# Patient Record
Sex: Female | Born: 1990 | Race: Black or African American | Hispanic: No | Marital: Married | Smoking: Former smoker
Health system: Southern US, Community
[De-identification: ages and names within clinical notes are randomized; demographics above are authoritative.]

## PROBLEM LIST (undated history)

## (undated) ENCOUNTER — Emergency Department (HOSPITAL_COMMUNITY): Payer: Medicaid Other | Source: Home / Self Care

---

## 2013-04-12 ENCOUNTER — Emergency Department (HOSPITAL_COMMUNITY)
Admission: EM | Admit: 2013-04-12 | Discharge: 2013-04-12 | Disposition: A | Discharge status: Home / Self Care | Payer: Medicaid Other | Attending: Emergency Medicine | Admitting: Emergency Medicine

## 2013-04-12 ENCOUNTER — Encounter (HOSPITAL_COMMUNITY): Payer: Self-pay | Admitting: Emergency Medicine

## 2013-04-12 DIAGNOSIS — Z3202 Encounter for pregnancy test, result negative: Secondary | ICD-10-CM | POA: Insufficient documentation

## 2013-04-12 DIAGNOSIS — R102 Pelvic and perineal pain: Secondary | ICD-10-CM

## 2013-04-12 DIAGNOSIS — Z79899 Other long term (current) drug therapy: Secondary | ICD-10-CM | POA: Insufficient documentation

## 2013-04-12 DIAGNOSIS — J309 Allergic rhinitis, unspecified: Secondary | ICD-10-CM | POA: Insufficient documentation

## 2013-04-12 DIAGNOSIS — R1904 Left lower quadrant abdominal swelling, mass and lump: Secondary | ICD-10-CM | POA: Insufficient documentation

## 2013-04-12 DIAGNOSIS — N390 Urinary tract infection, site not specified: Secondary | ICD-10-CM | POA: Insufficient documentation

## 2013-04-12 DIAGNOSIS — N949 Unspecified condition associated with female genital organs and menstrual cycle: Secondary | ICD-10-CM | POA: Insufficient documentation

## 2013-04-12 DIAGNOSIS — J3489 Other specified disorders of nose and nasal sinuses: Secondary | ICD-10-CM | POA: Insufficient documentation

## 2013-04-12 DIAGNOSIS — Z87891 Personal history of nicotine dependence: Secondary | ICD-10-CM | POA: Insufficient documentation

## 2013-04-12 LAB — URINALYSIS, ROUTINE W REFLEX MICROSCOPIC
Bilirubin Urine: NEGATIVE
Hgb urine dipstick: NEGATIVE
Ketones, ur: NEGATIVE mg/dL
Protein, ur: NEGATIVE mg/dL
Urobilinogen, UA: 0.2 mg/dL (ref 0.0–1.0)

## 2013-04-12 LAB — POCT PREGNANCY, URINE: Preg Test, Ur: NEGATIVE

## 2013-04-12 LAB — URINE MICROSCOPIC-ADD ON

## 2013-04-12 LAB — WET PREP, GENITAL: Clue Cells Wet Prep HPF POC: NONE SEEN

## 2013-04-12 LAB — GLUCOSE, CAPILLARY: Glucose-Capillary: 92 mg/dL (ref 70–99)

## 2013-04-12 MED ORDER — AZITHROMYCIN 250 MG PO TABS
1000.0000 mg | ORAL_TABLET | Freq: Once | ORAL | Status: AC
  Administered 2013-04-12: 1000 mg via ORAL
  Filled 2013-04-12: qty 4

## 2013-04-12 MED ORDER — FLUCONAZOLE 150 MG PO TABS: 150.0000 mg | ORAL_TABLET | Freq: Once | ORAL | Status: DC

## 2013-04-12 MED ORDER — LIDOCAINE HCL (PF) 1 % IJ SOLN
INTRAMUSCULAR | Status: AC
  Filled 2013-04-12: qty 5

## 2013-04-12 MED ORDER — CEFTRIAXONE SODIUM 250 MG IJ SOLR
250.0000 mg | Freq: Once | INTRAMUSCULAR | Status: AC
  Administered 2013-04-12: 250 mg via INTRAMUSCULAR
  Filled 2013-04-12: qty 250

## 2013-04-12 MED ORDER — NITROFURANTOIN MONOHYD MACRO 100 MG PO CAPS: 100.0000 mg | ORAL_CAPSULE | Freq: Two times a day (BID) | ORAL | Status: DC

## 2013-04-12 NOTE — ED Notes (Signed)
Pt states that she was diagnosed with yeast infection that keeps coming back.  LMP last week.  No diabetes

## 2013-04-12 NOTE — ED Provider Notes (Signed)
CSN: [id number]     Arrival date & time 04/12/13  1043 History   First MD Initiated Contact with Patient 04/12/13 1201     Chief Complaint  Patient presents with  . Vaginal Discharge   (Consider location/radiation/quality/duration/timing/severity/associated sxs/prior Treatment) Patient is a 22 y.o. female presenting with vaginal discharge.  Vaginal Discharge Associated symptoms: no fever    22 yo female presents with 2 wk hx of vaginal discharge. Patient admits to hx of yeast infections over the past year. Patient monagamous with 1 partner, though recently separated. Denies sexual intercourse in 3 months. Patient admits to white discharge that is malodorous with sxs of vaginal burning/itching. Denies abdominal pain, N/V/D/C. Admits to dysuria and pain with wiping. Pain rated 7/10. Patient has tried Diflucan and Clotrimazole in the past for similar sxs, but sxs continue to return follow treatment. Patient on implanon x 1 yr. PMH significant for Cesarean delivery x 2. LMP was 1-2 wks ago.  History reviewed. No pertinent past medical history. Past Surgical History  Procedure Laterality Date  . Cesarean section     No family history on file. History  Substance Use Topics  . Smoking status: Former Games developer  . Smokeless tobacco: Not on file  . Alcohol Use: No   OB History   Grav Para Term Preterm Abortions TAB SAB Ect Mult Living                 Review of Systems  Constitutional: Negative for fever and chills.  HENT: Positive for congestion and rhinorrhea.   Eyes: Negative for visual disturbance.  Respiratory: Negative for cough and shortness of breath.   Cardiovascular: Negative for chest pain and leg swelling.  Gastrointestinal: Negative for blood in stool.  Genitourinary: Positive for vaginal discharge. Negative for flank pain, vaginal bleeding, vaginal pain and pelvic pain.  Musculoskeletal: Negative for arthralgias and back pain.    Allergies  Review of patient's allergies  indicates no known allergies.  Home Medications   Current Outpatient Rx  Name  Route  Sig  Dispense  Refill  . clotrimazole (GYNE-LOTRIMIN) 1 % vaginal cream   Vaginal   Place 1 Applicatorful vaginally daily.         Marland Kitchen etonogestrel (IMPLANON) 68 MG IMPL implant   Subcutaneous   Inject 1 each into the skin once.         . fluconazole (DIFLUCAN) 150 MG tablet   Oral   Take 1 tablet (150 mg total) by mouth once.   1 tablet   0   . nitrofurantoin, macrocrystal-monohydrate, (MACROBID) 100 MG capsule   Oral   Take 1 capsule (100 mg total) by mouth 2 (two) times daily.   10 capsule   0    BP 105/66  Pulse 80  Temp(Src) 98.4 F (36.9 C) (Oral)  Resp 16  Wt 111 lb 8 oz (50.576 kg)  SpO2 99%  LMP 04/01/2013 Physical Exam  Nursing note and vitals reviewed. Constitutional: She is oriented to person, place, and time. She appears well-developed and well-nourished. No distress.  HENT:  Head: Normocephalic and atraumatic.  Cardiovascular: Normal rate and regular rhythm.  Exam reveals no gallop and no friction rub.   No murmur heard. Pulmonary/Chest: Effort normal and breath sounds normal. No respiratory distress. She has no wheezes. She has no rales.  Abdominal: Soft. Bowel sounds are normal. She exhibits mass. She exhibits no distension. There is tenderness. There is no rigidity, no rebound, no guarding, no tenderness at McBurney's point and  negative Murphy's sign.    Patient tender in LEFT adnexal region. Firm  mass palpated in LEFT adnexa.   Genitourinary: Pelvic exam was performed with patient supine. There is no rash, tenderness, lesion or injury on the right labia. There is no rash, tenderness, lesion or injury on the left labia. Right adnexum displays no mass and no tenderness. Left adnexum displays mass and tenderness. No erythema, tenderness or bleeding around the vagina. No foreign body around the vagina. No signs of injury around the vagina. Vaginal discharge (thick  clumpy white discharge) found.  Musculoskeletal: Normal range of motion. She exhibits no edema.  Neurological: She is alert and oriented to person, place, and time.  Skin: Skin is warm and dry. No rash noted. She is not diaphoretic.  Psychiatric: She has a normal mood and affect. Her behavior is normal.    ED Course  Procedures (including critical care time) Labs Review Labs Reviewed  WET PREP, GENITAL - Abnormal; Notable for the following:    Yeast Wet Prep HPF POC FEW (*)    WBC, Wet Prep HPF POC MODERATE (*)    All other components within normal limits  URINALYSIS, ROUTINE W REFLEX MICROSCOPIC - Abnormal; Notable for the following:    APPearance CLOUDY (*)    Leukocytes, UA SMALL (*)    All other components within normal limits  URINE MICROSCOPIC-ADD ON - Abnormal; Notable for the following:    Squamous Epithelial / LPF FEW (*)    Bacteria, UA MANY (*)    All other components within normal limits  GC/CHLAMYDIA PROBE AMP  URINE CULTURE  GLUCOSE, CAPILLARY  POCT PREGNANCY, URINE   Imaging Review No results found.  EKG Interpretation   None       MDM   1. UTI (lower urinary tract infection)   2. Pelvic pain     UA consistent with UTI. Plan to tx outpatient with Macrobid. Wet Prep positive for yeast and leukocytes. Will treat prophylactic for G/C and tx outpatient for yeast after patient finishes Macrobid due to suspect poor follow up because patient does not have insurance. Pelvic exam reveals tender left adnexal mass consistent with patient hx of ovarian cysts. Advised patient to follow up with [facility]. Patient also provided with resource guide for follow up reference. Patient educated on exam and lab findings. Educated on use and timing of medications. Recommended return to ED if sxs should worsen. Patient agrees with plan. Discharged in good condition.   Meds given in ED:  Medications  lidocaine (PF) (XYLOCAINE) 1 % injection (not  administered)  cefTRIAXone (ROCEPHIN) injection 250 mg (250 mg Intramuscular Given 04/12/13 1520)  azithromycin (ZITHROMAX) tablet 1,000 mg (1,000 mg Oral Given 04/12/13 1521)    Discharge Medication List as of 04/12/2013  3:04 PM    START taking these medications   Details  fluconazole (DIFLUCAN) 150 MG tablet Take 1 tablet (150 mg total) by mouth once., Starting 04/12/2013, Print    nitrofurantoin, macrocrystal-monohydrate, (MACROBID) 100 MG capsule Take 1 capsule (100 mg total) by mouth 2 (two) times daily., Starting 04/12/2013, Until Discontinued, Print           Rudene Anda, [place] 04/12/13 1810

## 2013-04-12 NOTE — ED Notes (Signed)
Pt was diagnosed with candidiasis approximately 4 months ago with same symptoms for exception of thickness of discharge.  Pt stated discharge was thicker then than it is now.

## 2013-04-12 NOTE — ED Provider Notes (Signed)
Medical screening examination/treatment/procedure(s) were performed by non-physician practitioner and as supervising physician I was immediately available for consultation/collaboration.  EKG Interpretation   None         Audree Camel, MD 04/12/13 2000

## 2013-04-12 NOTE — ED Notes (Signed)
CBG= 92 mg/dl

## 2013-04-13 LAB — URINE CULTURE

## 2014-11-25 ENCOUNTER — Encounter (HOSPITAL_COMMUNITY): Payer: Self-pay | Admitting: *Deleted

## 2014-11-25 ENCOUNTER — Emergency Department (HOSPITAL_COMMUNITY)
Admission: EM | Admit: 2014-11-25 | Discharge: 2014-11-25 | Disposition: A | Discharge status: Home / Self Care | Payer: Medicaid Other | Attending: Emergency Medicine | Admitting: Emergency Medicine

## 2014-11-25 DIAGNOSIS — Z87891 Personal history of nicotine dependence: Secondary | ICD-10-CM | POA: Insufficient documentation

## 2014-11-25 DIAGNOSIS — Z3202 Encounter for pregnancy test, result negative: Secondary | ICD-10-CM | POA: Insufficient documentation

## 2014-11-25 DIAGNOSIS — R109 Unspecified abdominal pain: Secondary | ICD-10-CM

## 2014-11-25 DIAGNOSIS — Z79899 Other long term (current) drug therapy: Secondary | ICD-10-CM | POA: Diagnosis not present

## 2014-11-25 DIAGNOSIS — R1030 Lower abdominal pain, unspecified: Secondary | ICD-10-CM | POA: Insufficient documentation

## 2014-11-25 LAB — WET PREP, GENITAL
Trich, Wet Prep: NONE SEEN
Yeast Wet Prep HPF POC: NONE SEEN

## 2014-11-25 LAB — POC URINE PREG, ED: Preg Test, Ur: NEGATIVE

## 2014-11-25 LAB — URINALYSIS, ROUTINE W REFLEX MICROSCOPIC
Bilirubin Urine: NEGATIVE
Glucose, UA: NEGATIVE mg/dL
Hgb urine dipstick: NEGATIVE
Ketones, ur: NEGATIVE mg/dL
Leukocytes, UA: NEGATIVE
Nitrite: NEGATIVE
Protein, ur: NEGATIVE mg/dL
Specific Gravity, Urine: 1.02 (ref 1.005–1.030)
Urobilinogen, UA: 0.2 mg/dL (ref 0.0–1.0)
pH: 7.5 (ref 5.0–8.0)

## 2014-11-25 MED ORDER — IBUPROFEN 400 MG PO TABS: 400.0000 mg | ORAL_TABLET | Freq: Four times a day (QID) | ORAL | Status: DC | PRN

## 2014-11-25 NOTE — ED Provider Notes (Signed)
CSN: [id number]     Arrival date & time 11/25/14  0547 History   First MD Initiated Contact with Patient 11/25/14 0557     Chief Complaint  Patient presents with  . Abdominal Pain    HPI   24 year old female presents today with suprapubic pain and dysuria. Patient reports symptoms started slowly approximately one week ago and have progressively worsened. She notes the addition of blood in her urine over the last several days. Patient reports she is sexually active, with Implanon for birth control, reports that she had that placed approximately 3 years ago. LMP 11/15/2014, denies vaginal discharge. Patient denies fever, chills, chest pain shortness of breath, dizziness, upper abdominal pain, back pain, changes in bowel characteristics or frequency. Patient denies any recent history of urinary tract infections. Tolerating by mouth.  History reviewed. No pertinent past medical history. Past Surgical History  Procedure Laterality Date  . Cesarean section     No family history on file. History  Substance Use Topics  . Smoking status: Former Games developer  . Smokeless tobacco: Not on file  . Alcohol Use: No   OB History    No data available     Review of Systems  All other systems reviewed and are negative.   Allergies  Review of patient's allergies indicates no known allergies.  Home Medications   Prior to Admission medications   Medication Sig Start Date End Date Taking? Authorizing Provider  etonogestrel (IMPLANON) 68 MG IMPL implant Inject 1 each into the skin once.   Yes Historical Provider, MD  ibuprofen (ADVIL,MOTRIN) 400 MG tablet Take 1 tablet (400 mg total) by mouth every 6 (six) hours as needed. 11/25/14   Eyvonne Mechanic, PA-C   BP 108/75 mmHg  Pulse 67  Temp(Src) 98 F (36.7 C) (Oral)  Resp 17  SpO2 99%  LMP 11/15/2014   Physical Exam  Constitutional: She is oriented to person, place, and time. She appears well-developed and well-nourished.  HENT:  Head: Normocephalic  and atraumatic.  Eyes: Conjunctivae are normal. Pupils are equal, round, and reactive to light. Right eye exhibits no discharge. Left eye exhibits no discharge. No scleral icterus.  Neck: Normal range of motion. No JVD present. No tracheal deviation present.  Cardiovascular: Normal rate, regular rhythm, normal heart sounds and intact distal pulses.   Pulmonary/Chest: Effort normal and breath sounds normal. No stridor. No respiratory distress. She has no wheezes. She has no rales. She exhibits no tenderness.  Abdominal: Soft. Bowel sounds are normal. She exhibits no distension and no mass. There is no hepatosplenomegaly, splenomegaly or hepatomegaly. There is tenderness in the suprapubic area. There is no rebound, no guarding and no CVA tenderness.  Neurological: She is alert and oriented to person, place, and time. Coordination normal.  Psychiatric: She has a normal mood and affect. Her behavior is normal. Judgment and thought content normal.  Nursing note and vitals reviewed.   ED Course  Procedures (including critical care time) Labs Review Labs Reviewed  WET PREP, GENITAL - Abnormal; Notable for the following:    Clue Cells Wet Prep HPF POC FEW (*)    WBC, Wet Prep HPF POC RARE (*)    All other components within normal limits  URINALYSIS, ROUTINE W REFLEX MICROSCOPIC (NOT AT [facility]) - Abnormal; Notable for the following:    APPearance CLOUDY (*)    All other components within normal limits  POC URINE PREG, ED  GC/CHLAMYDIA PROBE AMP ([place]) NOT AT [facility]    Imaging  Review No results found.   EKG Interpretation None      MDM   Final diagnoses:  Abdominal pain, unspecified abdominal location    Labs: Urine pregnancy, urinalysis- clue cells  Imaging:  Consults:  Therapeutics:  Discharge Meds:   Assessment/Plan: 24 year old female presents with suprapubic pain. She has no significant findings on her urinalysis sore physical exam. She is minimally tender to  palpation, remainder of abdomen is nontender. She is tolerating by mouth. Patient denies vaginal discharge, she did have a few clue cells on her wet prep, she reports this is not unusual for she's had bacterial vaginosis before without any symptoms. She had no cervical motion tenderness or significant discharge. Patient requested to leave as she had to pick up her daughter, there are no findings on my exam today that would necessitate her staying for further evaluation or management here in the ED. I informed her that she needs to follow-up with her primary care provider for further evaluation and management of the suprapubic pain. Patient verbalizes understanding and agreement to today's plan and had no further questions or concerns at time of discharge. Patient is advised to use ibuprofen or Tylenol as needed for the pain.         Eyvonne Mechanic, PA-C 11/25/14 1309  Blane Ohara, MD 11/27/14 [phone]

## 2014-11-25 NOTE — ED Notes (Signed)
The pt has nausea no vomiting or diarrhea

## 2014-11-25 NOTE — Discharge Instructions (Signed)
Abdominal Pain, Women °Abdominal (stomach, pelvic, or belly) pain can be caused by many things. It is important to tell your doctor: °· The location of the pain. °· Does it come and go or is it present all the time? °· Are there things that start the pain (eating certain foods, exercise)? °· Are there other symptoms associated with the pain (fever, nausea, vomiting, diarrhea)? °All of this is helpful to know when trying to find the cause of the pain. °CAUSES  °· Stomach: virus or bacteria infection, or ulcer. °· Intestine: appendicitis (inflamed appendix), regional ileitis (Crohn's disease), ulcerative colitis (inflamed colon), irritable bowel syndrome, diverticulitis (inflamed diverticulum of the colon), or cancer of the stomach or intestine. °· Gallbladder disease or stones in the gallbladder. °· Kidney disease, kidney stones, or infection. °· Pancreas infection or cancer. °· Fibromyalgia (pain disorder). °· Diseases of the female organs: °¨ Uterus: fibroid (non-cancerous) tumors or infection. °¨ Fallopian tubes: infection or tubal pregnancy. °¨ Ovary: cysts or tumors. °¨ Pelvic adhesions (scar tissue). °¨ Endometriosis (uterus lining tissue growing in the pelvis and on the pelvic organs). °¨ Pelvic congestion syndrome (female organs filling up with blood just before the menstrual period). °¨ Pain with the menstrual period. °¨ Pain with ovulation (producing an egg). °¨ Pain with an IUD (intrauterine device, birth control) in the uterus. °¨ Cancer of the female organs. °· Functional pain (pain not caused by a disease, may improve without treatment). °· Psychological pain. °· Depression. °DIAGNOSIS  °Your doctor will decide the seriousness of your pain by doing an examination. °· Blood tests. °· X-rays. °· Ultrasound. °· CT scan (computed tomography, special type of X-ray). °· MRI (magnetic resonance imaging). °· Cultures, for infection. °· Barium enema (dye inserted in the large intestine, to better view it with  X-rays). °· Colonoscopy (looking in intestine with a lighted tube). °· Laparoscopy (minor surgery, looking in abdomen with a lighted tube). °· Major abdominal exploratory surgery (looking in abdomen with a large incision). °TREATMENT  °The treatment will depend on the cause of the pain.  °· Many cases can be observed and treated at home. °· Over-the-counter medicines recommended by your caregiver. °· Prescription medicine. °· Antibiotics, for infection. °· Birth control pills, for painful periods or for ovulation pain. °· Hormone treatment, for endometriosis. °· Nerve blocking injections. °· Physical therapy. °· Antidepressants. °· Counseling with a psychologist or psychiatrist. °· Minor or major surgery. °HOME CARE INSTRUCTIONS  °· Do not take laxatives, unless directed by your caregiver. °· Take over-the-counter pain medicine only if ordered by your caregiver. Do not take aspirin because it can cause an upset stomach or bleeding. °· Try a clear liquid diet (broth or water) as ordered by your caregiver. Slowly move to a bland diet, as tolerated, if the pain is related to the stomach or intestine. °· Have a thermometer and take your temperature several times a day, and record it. °· Bed rest and sleep, if it helps the pain. °· Avoid sexual intercourse, if it causes pain. °· Avoid stressful situations. °· Keep your follow-up appointments and tests, as your caregiver orders. °· If the pain does not go away with medicine or surgery, you may try: °¨ Acupuncture. °¨ Relaxation exercises (yoga, meditation). °¨ Group therapy. °¨ Counseling. °SEEK MEDICAL CARE IF:  °· You notice certain foods cause stomach pain. °· Your home care treatment is not helping your pain. °· You need stronger pain medicine. °· You want your IUD removed. °· You feel faint or   lightheaded.  You develop nausea and vomiting.  You develop a rash.  You are having side effects or an allergy to your medicine. SEEK IMMEDIATE MEDICAL CARE IF:   Your  pain does not go away or gets worse.  You have a fever.  Your pain is felt only in portions of the abdomen. The right side could possibly be appendicitis. The left lower portion of the abdomen could be colitis or diverticulitis.  You are passing blood in your stools (bright red or black tarry stools, with or without vomiting).  You have blood in your urine.  You develop chills, with or without a fever.  You pass out. MAKE SURE YOU:   Understand these instructions.  Will watch your condition.  Will get help right away if you are not doing well or get worse. Document Released: 02/02/2007 Document Revised: 08/22/2013 Document Reviewed: 02/22/2009 [facility] Patient Information 2015 [place], [place]. This information is not intended to replace advice given to you by your health care provider. Make sure you discuss any questions you have with your health care provider.  Please monitor for new or worsening signs or symptoms, return immediately if any present. Please use ibuprofen as needed for pain.

## 2014-11-25 NOTE — ED Notes (Signed)
Called lab to check on status of wet prep

## 2014-11-25 NOTE — ED Notes (Signed)
The pt is c/o abd pain for one week.  lmp July 27th

## 2014-11-27 LAB — GC/CHLAMYDIA PROBE AMP (~~LOC~~) NOT AT ARMC
Chlamydia: NEGATIVE
Neisseria Gonorrhea: NEGATIVE

## 2015-04-03 ENCOUNTER — Ambulatory Visit (HOSPITAL_COMMUNITY)
Admission: RE | Admit: 2015-04-03 | Discharge: 2015-04-03 | Disposition: A | Discharge status: Home / Self Care | Payer: Medicaid Other | Attending: Psychiatry | Admitting: Psychiatry

## 2015-04-03 DIAGNOSIS — F39 Unspecified mood [affective] disorder: Secondary | ICD-10-CM | POA: Insufficient documentation

## 2015-04-03 DIAGNOSIS — F329 Major depressive disorder, single episode, unspecified: Secondary | ICD-10-CM | POA: Insufficient documentation
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
## 2015-06-15 ENCOUNTER — Inpatient Hospital Stay (EMERGENCY_DEPARTMENT_HOSPITAL)
Admission: AD | Admit: 2015-06-15 | Discharge: 2015-06-15 | Disposition: A | Discharge status: Home / Self Care | Payer: Medicaid Other | Source: Ambulatory Visit | Attending: Family Medicine | Admitting: Family Medicine

## 2015-06-15 ENCOUNTER — Emergency Department (HOSPITAL_COMMUNITY)
Admission: EM | Admit: 2015-06-15 | Discharge: 2015-06-15 | Disposition: A | Discharge status: Home / Self Care | Payer: Medicaid Other | Attending: Emergency Medicine | Admitting: Emergency Medicine

## 2015-06-15 ENCOUNTER — Encounter (HOSPITAL_COMMUNITY): Payer: Self-pay | Admitting: Emergency Medicine

## 2015-06-15 DIAGNOSIS — M79602 Pain in left arm: Secondary | ICD-10-CM | POA: Diagnosis not present

## 2015-06-15 DIAGNOSIS — Z975 Presence of (intrauterine) contraceptive device: Secondary | ICD-10-CM

## 2015-06-15 DIAGNOSIS — R1110 Vomiting, unspecified: Secondary | ICD-10-CM | POA: Diagnosis not present

## 2015-06-15 DIAGNOSIS — Z793 Long term (current) use of hormonal contraceptives: Secondary | ICD-10-CM | POA: Diagnosis not present

## 2015-06-15 DIAGNOSIS — Z789 Other specified health status: Secondary | ICD-10-CM

## 2015-06-15 DIAGNOSIS — Z87891 Personal history of nicotine dependence: Secondary | ICD-10-CM | POA: Diagnosis not present

## 2015-06-15 DIAGNOSIS — R634 Abnormal weight loss: Secondary | ICD-10-CM

## 2015-06-15 DIAGNOSIS — Z3202 Encounter for pregnancy test, result negative: Secondary | ICD-10-CM

## 2015-06-15 LAB — POCT PREGNANCY, URINE: PREG TEST UR: NEGATIVE

## 2015-06-15 MED ORDER — IBUPROFEN 800 MG PO TABS: 800.0000 mg | ORAL_TABLET | Freq: Three times a day (TID) | ORAL | Status: DC

## 2015-06-15 MED ORDER — PROMETHAZINE HCL 25 MG PO TABS: 12.5000 mg | ORAL_TABLET | Freq: Four times a day (QID) | ORAL | Status: DC | PRN

## 2015-06-15 NOTE — MAU Provider Note (Signed)
Chief Complaint: Arm Pain   None     SUBJECTIVE HPI: Carrie Conner is a 25 y.o.  who presents to maternity admissions reporting pain in her left arm at Nexplanon site and nausea/vomiting with onset 3 months ago causing 50 lb weight loss.  She reports the Nexplanon has been in place >3 years and was placed by her provider in [place].  She reports she is sexually active and is not using another form of birth control at this time.  She is not taking any medications for her nausea and reports nothing makes it better or worse.  It is not associated with pain. She denies vaginal bleeding, vaginal itching/burning, urinary symptoms, h/a, dizziness, or fever/chills.     HPI  No past medical history on file. Past Surgical History  Procedure Laterality Date  . Cesarean section     Social History   Social History  . Marital Status: Single    Spouse Name: N/A  . Number of Children: N/A  . Years of Education: N/A   Occupational History  . Not on file.   Social History Main Topics  . Smoking status: Former Games developer  . Smokeless tobacco: Not on file  . Alcohol Use: No  . Drug Use: No  . Sexual Activity: Not on file   Other Topics Concern  . Not on file   Social History Narrative   No current facility-administered medications on file prior to encounter.   Current Outpatient Prescriptions on File Prior to Encounter  Medication Sig Dispense Refill  . etonogestrel (IMPLANON) 68 MG IMPL implant Inject 1 each into the skin once.    Marland Kitchen ibuprofen (ADVIL,MOTRIN) 800 MG tablet Take 1 tablet (800 mg total) by mouth 3 (three) times daily. 21 tablet 0   No Known Allergies  ROS:  Review of Systems  Constitutional: Positive for appetite change and unexpected weight change. Negative for fever, chills and fatigue.  Respiratory: Negative for shortness of breath.   Cardiovascular: Negative for chest pain.  Gastrointestinal: Positive for nausea and vomiting.  Genitourinary: Negative for dysuria,  flank pain, vaginal bleeding, vaginal discharge, difficulty urinating, vaginal pain and pelvic pain.  Neurological: Negative for dizziness and headaches.  Psychiatric/Behavioral: Negative.      I have reviewed patient's Past Medical Hx, Surgical Hx, Family Hx, Social Hx, medications and allergies.   Physical Exam   Patient Vitals for the past 24 hrs:  BP Temp src Pulse  06/15/15 1158 106/61 mmHg Oral 76   Constitutional: Well-developed, well-nourished female in no acute distress.  Cardiovascular: normal rate Respiratory: normal effort GI: Abd soft, non-tender. Pos BS x 4 MS: Extremities nontender, no edema, normal ROM Neurologic: Alert and oriented x 4.  GU: Neg CVAT.   LAB RESULTS Results for orders placed or performed during the hospital encounter of 06/15/15 (from the past 24 hour(s))  Pregnancy, urine POC     Status: None   Collection Time: 06/15/15 12:04 PM  Result Value Ref Range   Preg Test, Ur NEGATIVE NEGATIVE       IMAGING No results found.  MAU Management/MDM: Pregnancy test ordered related to pt nausea/vomiting x 4 months.  UPT negative.  Discharge pt with Phenergan Rx. Pt to follow up with primary care provider.  Pt not in lobby when called to notify of negative results but pt aware of Rx.  ASSESSMENT 1. Intractable vomiting with nausea, vomiting of unspecified type   2. Weight loss, non-intentional   3. Nexplanon in place   4.  Negative pregnancy test     PLAN Discharge home Phenergan 12.5-25 mg PO Q 6 hours PRN   Medication List    TAKE these medications        ibuprofen 800 MG tablet  Commonly known as:  ADVIL,MOTRIN  Take 1 tablet (800 mg total) by mouth 3 (three) times daily.     IMPLANON 68 MG Impl implant  Generic drug:  etonogestrel  Inject 1 each into the skin once.     promethazine 25 MG tablet  Commonly known as:  PHENERGAN  Take 0.5-1 tablets (12.5-25 mg total) by mouth every 6 (six) hours as needed for nausea.        Follow-up Information    Please follow up.   Why:  Follow up soon with a primary care provider      Sharen Counter Certified Nurse-Midwife 06/15/2015  2:40 PM

## 2015-06-15 NOTE — MAU Note (Signed)
Sharen Counter in to see patient in triage.

## 2015-06-15 NOTE — Discharge Instructions (Signed)
Musculoskeletal Pain  Musculoskeletal pain is muscle and boney aches and pains. These pains can occur in any part of the body. Your caregiver may treat you without knowing the cause of the pain. They may treat you if blood or urine tests, X-rays, and other tests were normal.  CAUSES  There is often not a definite cause or reason for these pains. These pains may be caused by a type of germ (virus). The discomfort may also come from overuse. Overuse includes working out too hard when your body is not fit. Boney aches also come from weather changes. Bone is sensitive to atmospheric pressure changes.  HOME CARE INSTRUCTIONS  Ask when your test results will be ready. Make sure you get your test results.  Only take over-the-counter or prescription medicines for pain, discomfort, or fever as directed by your caregiver. If you were given medications for your condition, do not drive, operate machinery or power tools, or sign legal documents for 24 hours. Do not drink alcohol. Do not take sleeping pills or other medications that may interfere with treatment.  Continue all activities unless the activities cause more pain. When the pain lessens, slowly resume normal activities. Gradually increase the intensity and duration of the activities or exercise.  During periods of severe pain, bed rest may be helpful. Lay or sit in any position that is comfortable.  Putting ice on the injured area.  Put ice in a bag.  Place a towel between your skin and the bag.  Leave the ice on for 15 to 20 minutes, 3 to 4 times a day. Follow up with your caregiver for continued problems and no reason can be found for the pain. If the pain becomes worse or does not go away, it may be necessary to repeat tests or do additional testing. Your caregiver may need to look further for a possible cause. SEEK IMMEDIATE MEDICAL CARE IF:  You have pain that is getting worse and is not relieved by medications.  You develop chest pain that is  associated with shortness or breath, sweating, feeling sick to your stomach (nauseous), or throw up (vomit).  Your pain becomes localized to the abdomen.  You develop any new symptoms that seem different or that concern you. MAKE SURE YOU:  Understand these instructions.  Will watch your condition.  Will get help right away if you are not doing well or get worse. This information is not intended to replace advice given to you by your health care provider. Make sure you discuss any questions you have with your health care provider.  Document Released: 04/07/2005 Document Revised: 06/30/2011 Document Reviewed: 12/10/2012  Elsevier Interactive Patient Education 2016 Elsevier Inc[employer]   Contraceptive Implant Information A contraceptive implant is a plastic rod that is inserted under your skin. It is usually inserted under the skin of your upper arm. It continually releases small amounts of progestin (synthetic progesterone) into your bloodstream. This prevents an egg from being released from your ovaries. It also thickens your cervical mucus to prevent sperm from entering the cervix, and it thins your uterine lining to prevent a fertilized egg from attaching to your uterus. Contraceptive implants can be effective for up to 3 years. They do not provide protection against sexually transmitted diseases (STDs).  The procedure to insert an implant usually takes about 10 minutes. There may be minor bruising, swelling, and discomfort at the insertion site for a couple days. The implant begins to work within the first day. Other contraceptive protection  may be necessary for 7 days. Be sure to discuss with your health care provider if you need a backup method of contraception.  Your health care provider will make sure you are a good candidate for the contraceptive implant. Discuss with your health care provider the possible side effects of the implant. ADVANTAGES  It prevents pregnancy for up to 3 years.  It is  easily reversible.  It is convenient.  It can be used when breastfeeding.  It can be used by women who cannot take estrogen. DISADVANTAGES  You may have irregular or unplanned vaginal bleeding.  You may develop side effects, including headache, weight gain, acne, breast tenderness, or mood changes.  You may have tissue or nerve damage after insertion (rare).  It may be difficult and uncomfortable to remove.  Certain medicines may interfere with the effectiveness of the implant. REMOVAL OF IMPLANT The implant should be removed in 3 years or as directed by your health care provider. The implant's effect wears off in a few hours after removal. Your ability to get pregnant (fertility) may be restored in 1-2 weeks. A new implant can be inserted as soon as the old one is removed if desired. CONTRAINDICATIONS You should not get the implant if you are experiencing any of the following situations:  You are pregnant.  You have a history of breast cancer, osteoporosis, blood clots, heart disease, diabetes, high blood pressure, liver disease, tumors, or stroke.   You have undiagnosed vaginal bleeding.  You have a sensitivity to any part of the implant.   This information is not intended to replace advice given to you by your health care provider. Make sure you discuss any questions you have with your health care provider.   Document Released: 03/27/2011 Document Revised: 12/08/2012 Document Reviewed: 10/04/2012 Elsevier Interactive Patient Education [employer]

## 2015-06-15 NOTE — ED Provider Notes (Signed)
CSN: [id number]     Arrival date & time 06/15/15  1056 History  By signing my name below, I, Carrie Conner, attest that this documentation has been prepared under the direction and in the presence of Cheri Fowler, PA-C Electronically Signed: Charline Conner, ED Scribe 06/15/2015 at 11:26 AM.   Chief Complaint  Patient presents with  . Arm Pain   The history is provided by the patient. No language interpreter was used.   HPI Comments: Carrie Conner is a 25 y.o. female who presents to the Emergency Department complaining of constant, stinging left arm pain onset last week. Pt states that she had nexplanon implant placed in her left arm a little over 3 years ago. It was due to be removed in October, but states that she does not have a local OBGYN to remove it. Pt recently moved here from [facility]. She denies fever, chills, nausea, vomiting, redness.  History reviewed. No pertinent past medical history. Past Surgical History  Procedure Laterality Date  . Cesarean section     History reviewed. No pertinent family history. Social History  Substance Use Topics  . Smoking status: Former Games developer  . Smokeless tobacco: None  . Alcohol Use: No   OB History    No data available     Review of Systems  Constitutional: Negative for fever and chills.  Gastrointestinal: Negative for nausea and vomiting.  Musculoskeletal: Positive for myalgias.  All other systems reviewed and are negative.  Allergies  Review of patient's allergies indicates no known allergies.  Home Medications   Prior to Admission medications   Medication Sig Start Date End Date Taking? Authorizing Provider  etonogestrel (IMPLANON) 68 MG IMPL implant Inject 1 each into the skin once.    Historical Provider, MD  ibuprofen (ADVIL,MOTRIN) 800 MG tablet Take 1 tablet (800 mg total) by mouth 3 (three) times daily. 06/15/15   Breanna Shorkey, PA-C   BP 121/78 mmHg  Pulse 84  Temp(Src) 98 F (36.7 C) (Oral)  Resp 18  Ht  (1.626 m)  Wt  108 lb (48.988 kg)  BMI 18.53 kg/m2  SpO2 100% Physical Exam  Constitutional: She is oriented to person, place, and time. She appears well-developed and well-nourished.  HENT:  Head: Normocephalic and atraumatic.  Right Ear: External ear normal.  Left Ear: External ear normal.  Eyes: Conjunctivae are normal. No scleral icterus.  Neck: No tracheal deviation present.  Cardiovascular:  Capillary refill less than 3 seconds.   Pulmonary/Chest: Effort normal. No respiratory distress.  Abdominal: She exhibits no distension.  Musculoskeletal: Normal range of motion.  Implanon palpable on medial aspect of left upper arm.   Neurological: She is alert and oriented to person, place, and time.  Strength and sensation intact throughout upper and extremities.   Skin: Skin is warm and dry.  No erythema, induration, swelling, or ecchymosis.  No signs of infection.   Psychiatric: She has a normal mood and affect. Her behavior is normal.   ED Course  Procedures (including critical care time) DIAGNOSTIC STUDIES: Oxygen Saturation is 100% on RA, normal by my interpretation.    COORDINATION OF CARE: 11:23 AM-Discussed treatment plan which includes f/u with [facility] wiith pt at bedside and pt agreed to plan.   Labs Review Labs Reviewed - No data to display  Imaging Review No results found.   EKG Interpretation None      MDM   Final diagnoses:  Pain of left upper extremity  Uses birth control  Patient presents with left arm pain secondary to implanon.  VSS, NAD.  No signs of infection.  NVI.  Follow up with [facility] for removal.  Discussed return precautions.  Patient agrees and acknowledges the above plan for discharge.  I personally performed the services described in this documentation, which was scribed in my presence. The recorded information has been reviewed and is accurate.    Cheri Fowler, PA-C 06/15/15 1132  Pricilla Loveless, MD 06/16/15 1101

## 2015-06-15 NOTE — MAU Note (Signed)
Patient not in lobby

## 2015-06-15 NOTE — MAU Note (Signed)
Patient has had Implanon for over 3 years, having stinging in left arm, patient states she has not been able to eat because of nausea. Unsure if pregnant.

## 2015-06-15 NOTE — ED Notes (Signed)
Pt sts has nexplanon and sts due to have removed in October; pt sts now having pain in left arm

## 2015-07-09 ENCOUNTER — Encounter (HOSPITAL_COMMUNITY): Payer: Self-pay | Admitting: *Deleted

## 2015-07-09 DIAGNOSIS — Y9389 Activity, other specified: Secondary | ICD-10-CM | POA: Insufficient documentation

## 2015-07-09 DIAGNOSIS — S01511A Laceration without foreign body of lip, initial encounter: Secondary | ICD-10-CM | POA: Diagnosis not present

## 2015-07-09 DIAGNOSIS — X58XXXA Exposure to other specified factors, initial encounter: Secondary | ICD-10-CM | POA: Insufficient documentation

## 2015-07-09 DIAGNOSIS — R55 Syncope and collapse: Secondary | ICD-10-CM | POA: Insufficient documentation

## 2015-07-09 DIAGNOSIS — Z87891 Personal history of nicotine dependence: Secondary | ICD-10-CM | POA: Diagnosis not present

## 2015-07-09 DIAGNOSIS — Y9289 Other specified places as the place of occurrence of the external cause: Secondary | ICD-10-CM | POA: Diagnosis not present

## 2015-07-09 DIAGNOSIS — Y998 Other external cause status: Secondary | ICD-10-CM | POA: Diagnosis not present
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
## 2015-07-09 NOTE — ED Notes (Signed)
Spoke to Dr Madilyn Hookees regarding pt, order for CT head placed.

## 2015-07-10 ENCOUNTER — Emergency Department (HOSPITAL_COMMUNITY)
Admission: EM | Admit: 2015-07-10 | Discharge: 2015-07-10 | Disposition: A | Discharge status: Home / Self Care | Payer: Medicaid Other | Attending: Emergency Medicine | Admitting: Emergency Medicine

## 2015-07-10 NOTE — ED Notes (Signed)
Final call for a room with no answer

## 2015-07-10 NOTE — ED Notes (Signed)
CT called [facility] looking for this patient. Stated that they had tried to call her for her CT at least twice already. I called for them while on the phone with no answer.  That makes three times called with no answer.

## 2015-12-14 DIAGNOSIS — H9313 Tinnitus, bilateral: Secondary | ICD-10-CM | POA: Insufficient documentation

## 2015-12-14 DIAGNOSIS — H6123 Impacted cerumen, bilateral: Secondary | ICD-10-CM | POA: Insufficient documentation

## 2017-01-01 ENCOUNTER — Encounter (HOSPITAL_COMMUNITY): Payer: Self-pay

## 2017-01-01 ENCOUNTER — Emergency Department (HOSPITAL_COMMUNITY)
Admission: EM | Admit: 2017-01-01 | Discharge: 2017-01-01 | Disposition: A | Discharge status: Home / Self Care | Payer: Medicaid Other | Attending: Physician Assistant | Admitting: Physician Assistant

## 2017-01-01 ENCOUNTER — Emergency Department (HOSPITAL_COMMUNITY): Payer: Medicaid Other

## 2017-01-01 DIAGNOSIS — Y9289 Other specified places as the place of occurrence of the external cause: Secondary | ICD-10-CM | POA: Insufficient documentation

## 2017-01-01 DIAGNOSIS — Y93H9 Activity, other involving exterior property and land maintenance, building and construction: Secondary | ICD-10-CM | POA: Insufficient documentation

## 2017-01-01 DIAGNOSIS — Y998 Other external cause status: Secondary | ICD-10-CM | POA: Diagnosis not present

## 2017-01-01 DIAGNOSIS — Z79899 Other long term (current) drug therapy: Secondary | ICD-10-CM | POA: Insufficient documentation

## 2017-01-01 DIAGNOSIS — S6991XA Unspecified injury of right wrist, hand and finger(s), initial encounter: Secondary | ICD-10-CM | POA: Diagnosis present

## 2017-01-01 DIAGNOSIS — Z87891 Personal history of nicotine dependence: Secondary | ICD-10-CM | POA: Insufficient documentation

## 2017-01-01 DIAGNOSIS — Z23 Encounter for immunization: Secondary | ICD-10-CM | POA: Insufficient documentation

## 2017-01-01 DIAGNOSIS — S63681A Other sprain of right thumb, initial encounter: Secondary | ICD-10-CM | POA: Diagnosis not present

## 2017-01-01 DIAGNOSIS — W230XXA Caught, crushed, jammed, or pinched between moving objects, initial encounter: Secondary | ICD-10-CM | POA: Insufficient documentation

## 2017-01-01 MED ORDER — TETANUS-DIPHTH-ACELL PERTUSSIS 5-2.5-18.5 LF-MCG/0.5 IM SUSP
0.5000 mL | Freq: Once | INTRAMUSCULAR | Status: AC
  Administered 2017-01-01: 0.5 mL via INTRAMUSCULAR
  Filled 2017-01-01: qty 0.5

## 2017-01-01 NOTE — Progress Notes (Signed)
Orthopedic Tech Progress Note Patient Details:  Kathe MarinerShaneal Stefano 1990/12/20 [id number]  Ortho Devices Type of Ortho Device: Thumb velcro splint Ortho Device/Splint Interventions: Application   Saul FordyceJennifer C Kailani Brass 01/01/2017, 8:50 AM

## 2017-01-01 NOTE — ED Notes (Signed)
Ortho enroute to place splint

## 2017-01-01 NOTE — Discharge Instructions (Signed)
Wear a splint for comfort but be sure to continue to practice range of motion.

## 2017-01-01 NOTE — ED Provider Notes (Addendum)
[facility]   CSN: [id number] Arrival date & time: 01/01/17  0720     History   Chief Complaint Chief Complaint  Patient presents with  . Finger Injury    HPI Carrie Conner is a 26 y.o. female.  HPI   26 yo presenting with smashing her finger last night in a welding class. Patient reports that she smashed between 2 metal things. There is no abrasions. Patient has full range motion.  History reviewed. No pertinent past medical history.  There are no active problems to display for this patient.   Past Surgical History:  Procedure Laterality Date  . CESAREAN SECTION      OB History    No data available       Home Medications    Prior to Admission medications   Medication Sig Start Date End Date Taking? Authorizing Provider  etonogestrel (IMPLANON) 68 MG IMPL implant Inject 1 each into the skin once.    [provider]  ibuprofen (ADVIL,MOTRIN) 800 MG tablet Take 1 tablet (800 mg total) by mouth 3 (three) times daily. 06/15/15   Cheri Fowler, PA-C  promethazine (PHENERGAN) 25 MG tablet Take 0.5-1 tablets (12.5-25 mg total) by mouth every 6 (six) hours as needed for nausea. 06/15/15   Leftwich-Kirby, Wilmer Floor, CNM    Family History No family history on file.  Social History Social History  Substance Use Topics  . Smoking status: Former Games developer  . Smokeless tobacco: Never Used  . Alcohol use No     Allergies   Patient has no known allergies.   Review of Systems Review of Systems  Constitutional: Negative for activity change.  Respiratory: Negative for shortness of breath.   Gastrointestinal: Negative for abdominal pain.     Physical Exam Updated Vital Signs BP 121/87   Pulse 87   Temp 98 F (36.7 C) (Oral)   Resp 18   SpO2 99%   Physical Exam  Constitutional: She is oriented to person, place, and time. She appears well-developed and well-nourished. No distress.  HENT:  Head: Normocephalic and atraumatic.  Eyes:  Right eye exhibits no discharge. Left eye exhibits no discharge.  Cardiovascular: Normal rate, regular rhythm and normal heart sounds.   No murmur heard. Pulmonary/Chest: Effort normal and breath sounds normal.  Abdominal: Soft. She exhibits no distension. There is no tenderness.  Musculoskeletal:  Swollen, nothing to trephanate. Full ROM at each joint.   Neurological: She is oriented to person, place, and time.  Skin: Skin is warm and dry. She is not diaphoretic.  Psychiatric: She has a normal mood and affect.  Nursing note and vitals reviewed.    ED Treatments / Results  Labs (all labs ordered are listed, but only abnormal results are displayed) Labs Reviewed - No data to display  EKG  EKG Interpretation None       Radiology Dg Finger Thumb Right  Result Date: 01/01/2017 CLINICAL DATA:  Smashed right thumb last night.  Distal pain. EXAM: RIGHT THUMB 2+V COMPARISON:  None. FINDINGS: There is no evidence of fracture or dislocation. There is no evidence of arthropathy or other focal bone abnormality. Soft tissues are unremarkable IMPRESSION: Negative. Electronically Signed   By: Charlett Nose M.D.   On: 01/01/2017 08:23    Procedures Procedures (including critical care time)  Medications Ordered in ED Medications  Tdap (BOOSTRIX) injection 0.5 mL (0.5 mLs Intramuscular Given 01/01/17 0827)     Initial Impression / Assessment and Plan / ED Course  I have reviewed the triage vital signs and the nursing notes.  Pertinent labs & imaging results that were available during my care of the patient were reviewed by me and considered in my medical decision making (see chart for details).    26 yo presenting with smashing her finger last night in a welding class. Patient reports that she smashed between 2 metal things. There is no abrasions. Patient has full range motion. No signs of ligamentous injury or compartment syndome.   Will get xray.    Xray negative. Will give splint,  ice elevate.    Final Clinical Impressions(s) / ED Diagnoses   Final diagnoses:  Sprain of other site of right thumb, initial encounter    New Prescriptions New Prescriptions   No medications on file     Abelino DerrickMackuen, Encarnacion Bole Lyn, MD 01/01/17 0843    Abelino DerrickMackuen, Johnathin Vanderschaaf Lyn, MD 01/01/17 [phone]

## 2017-01-01 NOTE — ED Triage Notes (Signed)
Patient here with right hand thumb pain  After smashing same between 2 pieces of metal last pm, blood noted under nailbed

## 2017-02-17 ENCOUNTER — Encounter (HOSPITAL_COMMUNITY): Payer: Self-pay

## 2017-02-17 ENCOUNTER — Emergency Department (HOSPITAL_COMMUNITY)
Admission: EM | Admit: 2017-02-17 | Discharge: 2017-02-17 | Disposition: A | Discharge status: Home / Self Care | Payer: Medicaid Other | Attending: Emergency Medicine | Admitting: Emergency Medicine

## 2017-02-17 DIAGNOSIS — Z5321 Procedure and treatment not carried out due to patient leaving prior to being seen by health care provider: Secondary | ICD-10-CM | POA: Diagnosis not present

## 2017-02-17 DIAGNOSIS — R102 Pelvic and perineal pain: Secondary | ICD-10-CM | POA: Diagnosis present

## 2017-02-17 LAB — CBC
HCT: 34.2 % — ABNORMAL LOW (ref 36.0–46.0)
HEMOGLOBIN: 11.1 g/dL — AB (ref 12.0–15.0)
MCH: 28.2 pg (ref 26.0–34.0)
MCHC: 32.5 g/dL (ref 30.0–36.0)
MCV: 87 fL (ref 78.0–100.0)
PLATELETS: 222 10*3/uL (ref 150–400)
RBC: 3.93 MIL/uL (ref 3.87–5.11)
RDW: 14 % (ref 11.5–15.5)
WBC: 5.6 10*3/uL (ref 4.0–10.5)

## 2017-02-17 LAB — COMPREHENSIVE METABOLIC PANEL
ALK PHOS: 34 U/L — AB (ref 38–126)
ALT: 22 U/L (ref 14–54)
ANION GAP: 8 (ref 5–15)
AST: 22 U/L (ref 15–41)
Albumin: 3.8 g/dL (ref 3.5–5.0)
BILIRUBIN TOTAL: 0.6 mg/dL (ref 0.3–1.2)
BUN: 9 mg/dL (ref 6–20)
CALCIUM: 9.2 mg/dL (ref 8.9–10.3)
CO2: 23 mmol/L (ref 22–32)
CREATININE: 0.65 mg/dL (ref 0.44–1.00)
Chloride: 106 mmol/L (ref 101–111)
GFR calc non Af Amer: >60 mL/min (ref >60)
Glucose, Bld: 81 mg/dL (ref 65–99)
Potassium: 4 mmol/L (ref 3.5–5.1)
Sodium: 137 mmol/L (ref 135–145)
TOTAL PROTEIN: 6.6 g/dL (ref 6.5–8.1)

## 2017-02-17 LAB — URINALYSIS, ROUTINE W REFLEX MICROSCOPIC
Bilirubin Urine: NEGATIVE
GLUCOSE, UA: NEGATIVE mg/dL
KETONES UR: NEGATIVE mg/dL
NITRITE: NEGATIVE
PH: 5 (ref 5.0–8.0)
Protein, ur: NEGATIVE mg/dL
SPECIFIC GRAVITY, URINE: 1.029 (ref 1.005–1.030)

## 2017-02-17 LAB — HCG, QUANTITATIVE, PREGNANCY: hCG, Beta Chain, Quant, S: <1 m[IU]/mL (ref <5)

## 2017-02-17 LAB — LIPASE, BLOOD: Lipase: 29 U/L (ref 11–51)

## 2017-02-17 NOTE — ED Notes (Signed)
Pt called x1, no answer 

## 2017-02-17 NOTE — ED Triage Notes (Signed)
Patient complains of lower pelvic pain with discharge and vaginal pain with nausea and diarrhea x 1 week. States that she had recently found out she was pregnant and last week reports miscarriage with heavy bleeding last week.  Alert and oriented, NAD

## 2017-02-17 NOTE — ED Notes (Signed)
Patient called to be roomed, no answer 

## 2017-05-04 ENCOUNTER — Encounter (HOSPITAL_COMMUNITY): Payer: Self-pay | Admitting: Emergency Medicine

## 2017-05-04 ENCOUNTER — Ambulatory Visit (HOSPITAL_COMMUNITY)
Admission: EM | Admit: 2017-05-04 | Discharge: 2017-05-04 | Disposition: A | Discharge status: Home / Self Care | Payer: Medicaid Other | Attending: Internal Medicine | Admitting: Internal Medicine

## 2017-05-04 ENCOUNTER — Other Ambulatory Visit: Payer: Self-pay

## 2017-05-04 DIAGNOSIS — M7651 Patellar tendinitis, right knee: Secondary | ICD-10-CM
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
## 2017-05-04 NOTE — ED Provider Notes (Signed)
[facility]    CSN: [id number] Arrival date & time: 05/04/17  1038     History   Chief Complaint Chief Complaint  Patient presents with  . Knee Pain    HPI Carrie Conner is a 27 y.o. female.   27 yo female with no chronic medical problems c/o right knee pain. She does not recall injuring the knee. 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 Smaller than it used to be but the knee hurts when she stands from seated position or kneels.      History reviewed. No pertinent past medical history.  There are no active problems to display for this patient.   Past Surgical History:  Procedure Laterality Date  . CESAREAN SECTION      OB History    No data available       Home Medications    Prior to Admission medications   Medication Sig Start Date End Date Taking? Authorizing Provider  etonogestrel (IMPLANON) 68 MG IMPL implant Inject 1 each into the skin once.    [provider]  ibuprofen (ADVIL,MOTRIN) 800 MG tablet Take 1 tablet (800 mg total) by mouth 3 (three) times daily. Patient not taking: Reported on 05/04/2017 06/15/15   Cheri Fowlerose, Kayla, PA-C  promethazine (PHENERGAN) 25 MG tablet Take 0.5-1 tablets (12.5-25 mg total) by mouth every 6 (six) hours as needed for nausea. Patient not taking: Reported on 05/04/2017 06/15/15   Hurshel PartyLeftwich-Kirby, Lisa A, CNM    Family History No family history on file.  Social History Social History   Tobacco Use  . Smoking status: Former Games developermoker  . Smokeless tobacco: Never Used  Substance Use Topics  . Alcohol use: No  . Drug use: No     Allergies   Patient has no known allergies.   Review of Systems Review of Systems  Constitutional: Negative for chills and fever.  HENT: Negative for sore throat and tinnitus.   Eyes: Negative for redness.  Respiratory: Negative for cough and shortness of breath.   Cardiovascular: Negative for chest pain  and palpitations.  Gastrointestinal: Negative for abdominal pain, diarrhea, nausea and vomiting.  Genitourinary: Negative for dysuria, frequency and urgency.  Musculoskeletal: Positive for joint swelling. Negative for myalgias.  Skin: Negative for rash.       No lesions  Neurological: Negative for weakness.  Hematological: Does not bruise/bleed easily.  Psychiatric/Behavioral: Negative for suicidal ideas.     Physical Exam Triage Vital Signs ED Triage Vitals  Enc Vitals Group     BP 05/04/17 1104 115/70     Pulse Rate 05/04/17 1104 82     Resp 05/04/17 1104 16     Temp 05/04/17 1104 98.1 F (36.7 C)     Temp src --      SpO2 05/04/17 1104 100 %     Weight --      Height --      Head Circumference --      Peak Flow --      Pain Score 05/04/17 1105 6     Pain Loc --      Pain Edu? --      Excl. in GC? --    No data found.  Updated Vital Signs BP 115/70   Pulse 82   Temp 98.1 F (36.7 C)   Resp 16   LMP 04/20/2017   SpO2 100%   Visual Acuity Right Eye Distance:  Left Eye Distance:   Bilateral Distance:    Right Eye Near:   Left Eye Near:    Bilateral Near:     Physical Exam  Constitutional: She is oriented to person, place, and time. She appears well-developed and well-nourished. No distress.  HENT:  Head: Normocephalic and atraumatic.  Mouth/Throat: Oropharynx is clear and moist.  Eyes: Conjunctivae and EOM are normal. Pupils are equal, round, and reactive to light. No scleral icterus.  Neck: Normal range of motion. Neck supple. No JVD present. No tracheal deviation present. No thyromegaly present.  Cardiovascular: Normal rate, regular rhythm and normal heart sounds. Exam reveals no gallop and no friction rub.  No murmur heard. Pulmonary/Chest: Effort normal and breath sounds normal.  Abdominal: Soft. Bowel sounds are normal. She exhibits no distension. There is no tenderness.  Musculoskeletal: Normal range of motion. She exhibits no edema.       Right  knee: She exhibits effusion.  Right knee is not ballotable but there is some mild effusion inferomedially  Lymphadenopathy:    She has no cervical adenopathy.  Neurological: She is alert and oriented to person, place, and time. No cranial nerve deficit.  Skin: Skin is warm and dry.  Psychiatric: She has a normal mood and affect. Her behavior is normal. Judgment and thought content normal.  Nursing note and vitals reviewed.    UC Treatments / Results  Labs (all labs ordered are listed, but only abnormal results are displayed) Labs Reviewed - No data to display  EKG  EKG Interpretation None       Radiology No results found.  Procedures Procedures (including critical care time)  Medications Ordered in UC Medications - No data to display   Initial Impression / Assessment and Plan / UC Course  I have reviewed the triage vital signs and the nursing notes.  Pertinent labs & imaging results that were available during my care of the patient were reviewed by me and considered in my medical decision making (see chart for details).     Pain over patellar tendon secondary to overuse. NSAID, RICE + patellar compression band.  Final Clinical Impressions(s) / UC Diagnoses   Final diagnoses:  Patellar tendonitis of right knee    ED Discharge Orders    None       Controlled Substance Prescriptions [place] Controlled Substance Registry consulted? Not Applicable   Arnaldo Natal, MD 05/04/17 1214

## 2017-12-10 ENCOUNTER — Ambulatory Visit (HOSPITAL_COMMUNITY)
Admission: EM | Admit: 2017-12-10 | Discharge: 2017-12-10 | Disposition: A | Discharge status: Home / Self Care | Payer: Medicaid Other | Attending: Family Medicine | Admitting: Family Medicine

## 2017-12-10 ENCOUNTER — Encounter (HOSPITAL_COMMUNITY): Payer: Self-pay

## 2017-12-10 ENCOUNTER — Other Ambulatory Visit: Payer: Self-pay

## 2017-12-10 DIAGNOSIS — N760 Acute vaginitis: Secondary | ICD-10-CM | POA: Diagnosis not present

## 2017-12-10 DIAGNOSIS — N898 Other specified noninflammatory disorders of vagina: Secondary | ICD-10-CM | POA: Diagnosis not present

## 2017-12-10 LAB — POCT URINALYSIS DIP (DEVICE)
BILIRUBIN URINE: NEGATIVE
Glucose, UA: NEGATIVE mg/dL
KETONES UR: NEGATIVE mg/dL
Leukocytes, UA: NEGATIVE
Nitrite: NEGATIVE
Protein, ur: NEGATIVE mg/dL
Specific Gravity, Urine: >=1.03 (ref 1.005–1.030)
Urobilinogen, UA: 0.2 mg/dL (ref 0.0–1.0)
pH: 6.5 (ref 5.0–8.0)

## 2017-12-10 MED ORDER — METRONIDAZOLE 500 MG PO TABS: 500.0000 mg | ORAL_TABLET | Freq: Two times a day (BID) | ORAL | 0 refills | Status: DC

## 2017-12-10 MED ORDER — METRONIDAZOLE 500 MG PO TABS: 500.0000 mg | ORAL_TABLET | Freq: Two times a day (BID) | ORAL | 0 refills | Status: AC

## 2017-12-10 NOTE — ED Triage Notes (Signed)
[facility], pt complains of odor, frequent urination and itching x2 days. Py has taken AZO and itching has stopped.

## 2017-12-10 NOTE — Discharge Instructions (Signed)
We will start treatment for BV at this time.  Will notify you of any positive findings from your vaginal swab and if any changes to treatment are needed.   Please withhold from intercourse for the next week. Do not drink alcohol while taking this medication.  Please use condoms to prevent STD's.   If symptoms worsen or do not improve in the next week to return to be seen or to follow up with your PCP.

## 2017-12-10 NOTE — ED Provider Notes (Signed)
[facility]    CSN: [id number] Arrival date & time: 12/10/17  1827     History   Chief Complaint Chief Complaint  Patient presents with  . Vaginitis    HPI Carrie Conner is a 27 y.o. female.   Carrie Conner presents with complaints of vaginal odor which was worse after intercourse 3 days ago. Minimal vaginal discharge. States a week ago she took a One day OTC yeast medication as she had itching. Itching has resolved. Denies concerns for stds. No sores, lesions to the vulva. No vaginal bleeding. States has baseline urinary frequency but no pain or urgency with urination. LMP 7/29. She is not on birth control. No abdominal pain. No back pain. No fevers. Has had both BV and yeast in the past.   ROS per HPI.      History reviewed. No pertinent past medical history.  There are no active problems to display for this patient.   Past Surgical History:  Procedure Laterality Date  . CESAREAN SECTION      OB History   None      Home Medications    Prior to Admission medications   Medication Sig Start Date End Date Taking? Authorizing Provider  etonogestrel (IMPLANON) 68 MG IMPL implant Inject 1 each into the skin once.    [provider]  ibuprofen (ADVIL,MOTRIN) 800 MG tablet Take 1 tablet (800 mg total) by mouth 3 (three) times daily. Patient not taking: Reported on 05/04/2017 06/15/15   Cheri Fowler, PA-C  metroNIDAZOLE (FLAGYL) 500 MG tablet Take 1 tablet (500 mg total) by mouth 2 (two) times daily for 7 days. 12/10/17 12/17/17  Georgetta Haber, NP  promethazine (PHENERGAN) 25 MG tablet Take 0.5-1 tablets (12.5-25 mg total) by mouth every 6 (six) hours as needed for nausea. Patient not taking: Reported on 05/04/2017 06/15/15   Hurshel Party, CNM    Family History History reviewed. No pertinent family history.  Social History Social History   Tobacco Use  . Smoking status: Former Games developer  . Smokeless tobacco: Never Used  Substance Use Topics    . Alcohol use: No  . Drug use: Yes    Types: Marijuana     Allergies   Patient has no known allergies.   Review of Systems Review of Systems   Physical Exam Triage Vital Signs ED Triage Vitals  Enc Vitals Group     BP 12/10/17 1846 109/62     Pulse Rate 12/10/17 1846 80     Resp 12/10/17 1846 17     Temp 12/10/17 1846 98.7 F (37.1 C)     Temp Source 12/10/17 1846 Oral     SpO2 12/10/17 1846 100 %     Weight --      Height --      Head Circumference --      Peak Flow --      Pain Score 12/10/17 1847 0     Pain Loc --      Pain Edu? --      Excl. in GC? --    No data found.  Updated Vital Signs BP 109/62 (BP Location: Left Arm)   Pulse 80   Temp 98.7 F (37.1 C) (Oral)   Resp 17   LMP 11/16/2017 (Exact Date)   SpO2 100%    Physical Exam  Constitutional: She is oriented to person, place, and time. She appears well-developed and well-nourished. No distress.  Cardiovascular: Normal rate, regular rhythm and normal heart sounds.  Pulmonary/Chest: Effort normal and breath sounds normal.  Abdominal: Soft. There is no tenderness. There is no rigidity, no rebound, no guarding and no CVA tenderness.  Genitourinary:  Genitourinary Comments: Denies sores, lesions, vaginal bleeding; no pelvic pain; gu exam deferred at this time, vaginal self swab collected.    Neurological: She is alert and oriented to person, place, and time.  Skin: Skin is warm and dry.     UC Treatments / Results  Labs (all labs ordered are listed, but only abnormal results are displayed) Labs Reviewed  POCT URINALYSIS DIP (DEVICE) - Abnormal; Notable for the following components:      Result Value   Hgb urine dipstick TRACE (*)    All other components within normal limits  URINE CULTURE  CERVICOVAGINAL ANCILLARY ONLY    EKG None  Radiology No results found.  Procedures Procedures (including critical care time)  Medications Ordered in UC Medications - No data to  display  Initial Impression / Assessment and Plan / UC Course  I have reviewed the triage vital signs and the nursing notes.  Pertinent labs & imaging results that were available during my care of the patient were reviewed by me and considered in my medical decision making (see chart for details).     Trace hgb in urine. Sent for culture. Vaginal cytology pending. Started flagyl pending vaginal cytology. Will notify of any positive findings and if any changes to treatment are needed.  Safe sex practices encouraged. If symptoms worsen or do not improve in the next week to return to be seen or to follow up with PCP.  Patient verbalized understanding and agreeable to plan.    Final Clinical Impressions(s) / UC Diagnoses   Final diagnoses:  Acute vaginitis     Discharge Instructions     We will start treatment for BV at this time.  Will notify you of any positive findings from your vaginal swab and if any changes to treatment are needed.   Please withhold from intercourse for the next week. Do not drink alcohol while taking this medication.  Please use condoms to prevent STD's.   If symptoms worsen or do not improve in the next week to return to be seen or to follow up with your PCP.     ED Prescriptions    Medication Sig Dispense Auth. Provider   metroNIDAZOLE (FLAGYL) 500 MG tablet Take 1 tablet (500 mg total) by mouth 2 (two) times daily for 7 days. 14 tablet Georgetta HaberBurky, Natalie B, NP     Controlled Substance Prescriptions [place] Controlled Substance Registry consulted? Not Applicable   Georgetta HaberBurky, Natalie B, NP 12/10/17 Serena [id number]

## 2017-12-11 ENCOUNTER — Telehealth (HOSPITAL_COMMUNITY): Payer: Self-pay

## 2017-12-11 LAB — CERVICOVAGINAL ANCILLARY ONLY
BACTERIAL VAGINITIS: NEGATIVE
CHLAMYDIA, DNA PROBE: POSITIVE — AB
Candida vaginitis: NEGATIVE
Neisseria Gonorrhea: NEGATIVE
Trichomonas: NEGATIVE

## 2017-12-11 MED ORDER — AZITHROMYCIN 250 MG PO TABS: 1000.0000 mg | ORAL_TABLET | Freq: Once | ORAL | 0 refills | Status: AC

## 2017-12-11 NOTE — Telephone Encounter (Signed)
Chlamydia is positive.  Rx po zithromax 1g #1 dose no refills was sent to the pharmacy of record.  Pt contacted and made aware, educated to please refrain from sexual intercourse for 7 days to give the medicine time to work, sexual partners need to be notified and tested/treated.  Condoms may reduce risk of reinfection.  Recheck or followup with PCP for further evaluation if symptoms are not improving.   [facility] notified  

## 2017-12-12 LAB — URINE CULTURE: Culture: 10000 — AB

## 2017-12-24 ENCOUNTER — Other Ambulatory Visit: Payer: Self-pay

## 2017-12-24 ENCOUNTER — Ambulatory Visit (HOSPITAL_COMMUNITY)
Admission: EM | Admit: 2017-12-24 | Discharge: 2017-12-24 | Disposition: A | Discharge status: Home / Self Care | Payer: Medicaid Other | Attending: Family Medicine | Admitting: Family Medicine

## 2017-12-24 ENCOUNTER — Encounter (HOSPITAL_COMMUNITY): Payer: Self-pay | Admitting: *Deleted

## 2017-12-24 DIAGNOSIS — Z79899 Other long term (current) drug therapy: Secondary | ICD-10-CM | POA: Insufficient documentation

## 2017-12-24 DIAGNOSIS — B9789 Other viral agents as the cause of diseases classified elsewhere: Secondary | ICD-10-CM | POA: Diagnosis not present

## 2017-12-24 DIAGNOSIS — Z87891 Personal history of nicotine dependence: Secondary | ICD-10-CM | POA: Insufficient documentation

## 2017-12-24 DIAGNOSIS — N760 Acute vaginitis: Secondary | ICD-10-CM | POA: Insufficient documentation

## 2017-12-24 DIAGNOSIS — J069 Acute upper respiratory infection, unspecified: Secondary | ICD-10-CM | POA: Diagnosis not present

## 2017-12-24 DIAGNOSIS — B9689 Other specified bacterial agents as the cause of diseases classified elsewhere: Secondary | ICD-10-CM | POA: Insufficient documentation

## 2017-12-24 DIAGNOSIS — R05 Cough: Secondary | ICD-10-CM | POA: Insufficient documentation

## 2017-12-24 DIAGNOSIS — R0981 Nasal congestion: Secondary | ICD-10-CM | POA: Diagnosis present

## 2017-12-24 MED ORDER — IPRATROPIUM BROMIDE 0.06 % NA SOLN: 2.0000 | Freq: Four times a day (QID) | NASAL | 12 refills | Status: DC

## 2017-12-24 MED ORDER — CETIRIZINE-PSEUDOEPHEDRINE ER 5-120 MG PO TB12: 1.0000 | ORAL_TABLET | Freq: Every day | ORAL | 0 refills | Status: DC

## 2017-12-24 NOTE — ED Triage Notes (Signed)
C/o nasal congestion c/o cough

## 2017-12-24 NOTE — Discharge Instructions (Signed)
Push fluids to ensure adequate hydration and keep secretions thin.  Tylenol and/or ibuprofen as needed for pain or fevers.  Nasal spray 2-4 times a day for congestion and drainage.  Daily zyrtec d.  We will retest your vaginal swab and notify you of any positive findings. Please withhold from intercourse.  Please follow up with pcp for recheck for any persistent symptoms.

## 2017-12-24 NOTE — ED Provider Notes (Signed)
[facility]    CSN: [id number] Arrival date & time: 12/24/17  1939     History   Chief Complaint Chief Complaint  Patient presents with  . Nasal Congestion  . Cough    HPI Cilicia Carnal is a 27 y.o. female.   Rosealee presents with complaints of congestion cough which started approximately 6 days ago. Runny nose has improved. At times feels shortness of breath related to congestion. No fevers. No sore throat, no ear pain. No gi/gu complaint. Took claritin a few days which did not help. No known ill contacts. No history of asthma. Also complaints of vaginal discharge which has been persistent. Was treated for positive chlamydia after visit 8/22. States discharge has persisted and worse today. Did have intercourse today. States partner was also treated. No pain or bleeding. No urinary symptoms. No known odor and no itching.    ROS per HPI.      History reviewed. No pertinent past medical history.  There are no active problems to display for this patient.   Past Surgical History:  Procedure Laterality Date  . CESAREAN SECTION      OB History   None      Home Medications    Prior to Admission medications   Medication Sig Start Date End Date Taking? Authorizing Provider  cetirizine-pseudoephedrine (ZYRTEC-D) 5-120 MG tablet Take 1 tablet by mouth daily. 12/24/17   Georgetta Haber, NP  etonogestrel (IMPLANON) 68 MG IMPL implant Inject 1 each into the skin once.    [provider]  ipratropium (ATROVENT) 0.06 % nasal spray Place 2 sprays into both nostrils 4 (four) times daily. 12/24/17   Georgetta Haber, NP    Family History No family history on file.  Social History Social History   Tobacco Use  . Smoking status: Former Games developer  . Smokeless tobacco: Never Used  Substance Use Topics  . Alcohol use: No  . Drug use: Yes    Types: Marijuana     Allergies   Patient has no known allergies.   Review of Systems Review of  Systems   Physical Exam Triage Vital Signs ED Triage Vitals  Enc Vitals Group     BP 12/24/17 2012 118/62     Pulse Rate 12/24/17 2012 90     Resp 12/24/17 2012 18     Temp 12/24/17 2012 98.2 F (36.8 C)     Temp Source 12/24/17 2012 Oral     SpO2 12/24/17 2012 99 %     Weight --      Height --      Head Circumference --      Peak Flow --      Pain Score 12/24/17 2014 6     Pain Loc --      Pain Edu? --      Excl. in GC? --    No data found.  Updated Vital Signs BP 118/62 (BP Location: Left Arm)   Pulse 90   Temp 98.2 F (36.8 C) (Oral)   Resp 18   LMP 11/23/2017 (Approximate)   SpO2 99%    Physical Exam  Constitutional: She is oriented to person, place, and time. She appears well-developed and well-nourished. No distress.  HENT:  Head: Normocephalic and atraumatic.  Right Ear: Tympanic membrane, external ear and ear canal normal.  Left Ear: Tympanic membrane, external ear and ear canal normal.  Nose: Nose normal.  Mouth/Throat: Uvula is midline, oropharynx is clear and moist and mucous  membranes are normal. No tonsillar exudate.  Eyes: Pupils are equal, round, and reactive to light. Conjunctivae and EOM are normal.  Cardiovascular: Normal rate, regular rhythm and normal heart sounds.  Pulmonary/Chest: Effort normal and breath sounds normal.  Abdominal: Soft. There is no tenderness. There is no rigidity, no rebound, no guarding and no CVA tenderness.  Genitourinary:  Genitourinary Comments: Denies sores, lesions, vaginal bleeding; no pelvic pain; gu exam deferred at this time, vaginal self swab collected.     Neurological: She is alert and oriented to person, place, and time.  Skin: Skin is warm and dry.     UC Treatments / Results  Labs (all labs ordered are listed, but only abnormal results are displayed) Labs Reviewed  CERVICOVAGINAL ANCILLARY ONLY    EKG None  Radiology No results found.  Procedures Procedures (including critical care  time)  Medications Ordered in UC Medications - No data to display  Initial Impression / Assessment and Plan / UC Course  I have reviewed the triage vital signs and the nursing notes.  Pertinent labs & imaging results that were available during my care of the patient were reviewed by me and considered in my medical decision making (see chart for details).     Benign physical exam. Non toxic in appearance. Afebrile. History and physical consistent with viral illness.  Supportive cares recommended. Repeat vaginal cytology today. Will notify of any positive findings and if any changes to treatment are needed.  Encouraged safe sex practices. Patient verbalized understanding and agreeable to plan.   Final Clinical Impressions(s) / UC Diagnoses   Final diagnoses:  Viral URI with cough  Acute vaginitis     Discharge Instructions     Push fluids to ensure adequate hydration and keep secretions thin.  Tylenol and/or ibuprofen as needed for pain or fevers.  Nasal spray 2-4 times a day for congestion and drainage.  Daily zyrtec d.  We will retest your vaginal swab and notify you of any positive findings. Please withhold from intercourse.  Please follow up with pcp for recheck for any persistent symptoms.    ED Prescriptions    Medication Sig Dispense Auth. Provider   ipratropium (ATROVENT) 0.06 % nasal spray Place 2 sprays into both nostrils 4 (four) times daily. 15 mL Royalty Fakhouri, Dorene Grebe B, NP   cetirizine-pseudoephedrine (ZYRTEC-D) 5-120 MG tablet Take 1 tablet by mouth daily. 30 tablet Georgetta Haber, NP     Controlled Substance Prescriptions [place] Controlled Substance Registry consulted? Not Applicable   Georgetta Haber, NP 12/24/17 2039

## 2017-12-25 LAB — CERVICOVAGINAL ANCILLARY ONLY
BACTERIAL VAGINITIS: NEGATIVE
Candida vaginitis: NEGATIVE
Chlamydia: NEGATIVE
NEISSERIA GONORRHEA: NEGATIVE
Trichomonas: NEGATIVE

## 2018-03-25 ENCOUNTER — Emergency Department (HOSPITAL_COMMUNITY)
Admission: EM | Admit: 2018-03-25 | Discharge: 2018-03-25 | Disposition: A | Discharge status: Home / Self Care | Payer: Medicaid Other | Attending: Emergency Medicine | Admitting: Emergency Medicine

## 2018-03-25 ENCOUNTER — Other Ambulatory Visit: Payer: Self-pay

## 2018-03-25 ENCOUNTER — Emergency Department (HOSPITAL_COMMUNITY): Payer: Medicaid Other

## 2018-03-25 ENCOUNTER — Encounter (HOSPITAL_COMMUNITY): Payer: Self-pay | Admitting: Emergency Medicine

## 2018-03-25 DIAGNOSIS — R1030 Lower abdominal pain, unspecified: Secondary | ICD-10-CM

## 2018-03-25 DIAGNOSIS — R1084 Generalized abdominal pain: Secondary | ICD-10-CM

## 2018-03-25 DIAGNOSIS — R112 Nausea with vomiting, unspecified: Secondary | ICD-10-CM

## 2018-03-25 DIAGNOSIS — R109 Unspecified abdominal pain: Secondary | ICD-10-CM | POA: Insufficient documentation

## 2018-03-25 DIAGNOSIS — R197 Diarrhea, unspecified: Secondary | ICD-10-CM

## 2018-03-25 DIAGNOSIS — Z87891 Personal history of nicotine dependence: Secondary | ICD-10-CM | POA: Insufficient documentation

## 2018-03-25 LAB — COMPREHENSIVE METABOLIC PANEL
ALT: 14 U/L (ref 0–44)
AST: 16 U/L (ref 15–41)
Albumin: 4.5 g/dL (ref 3.5–5.0)
Alkaline Phosphatase: 44 U/L (ref 38–126)
Anion gap: 7 (ref 5–15)
BUN: 8 mg/dL (ref 6–20)
CO2: 25 mmol/L (ref 22–32)
Calcium: 9.2 mg/dL (ref 8.9–10.3)
Chloride: 107 mmol/L (ref 98–111)
Creatinine, Ser: 0.57 mg/dL (ref 0.44–1.00)
GFR calc Af Amer: >60 mL/min (ref >60)
GFR calc non Af Amer: >60 mL/min (ref >60)
Glucose, Bld: 80 mg/dL (ref 70–99)
Potassium: 3.6 mmol/L (ref 3.5–5.1)
Sodium: 139 mmol/L (ref 135–145)
Total Bilirubin: 0.8 mg/dL (ref 0.3–1.2)
Total Protein: 7.4 g/dL (ref 6.5–8.1)

## 2018-03-25 LAB — CBC WITH DIFFERENTIAL/PLATELET
Abs Immature Granulocytes: 0.02 10*3/uL (ref 0.00–0.07)
Basophils Absolute: 0 10*3/uL (ref 0.0–0.1)
Basophils Relative: 1 %
Eosinophils Absolute: 0.3 10*3/uL (ref 0.0–0.5)
Eosinophils Relative: 5 %
HCT: 37.7 % (ref 36.0–46.0)
Hemoglobin: 12 g/dL (ref 12.0–15.0)
Immature Granulocytes: 0 %
Lymphocytes Relative: 32 %
Lymphs Abs: 2.2 10*3/uL (ref 0.7–4.0)
MCH: 29.2 pg (ref 26.0–34.0)
MCHC: 31.8 g/dL (ref 30.0–36.0)
MCV: 91.7 fL (ref 80.0–100.0)
Monocytes Absolute: 0.5 10*3/uL (ref 0.1–1.0)
Monocytes Relative: 8 %
Neutro Abs: 3.8 10*3/uL (ref 1.7–7.7)
Neutrophils Relative %: 54 %
Platelets: 263 10*3/uL (ref 150–400)
RBC: 4.11 MIL/uL (ref 3.87–5.11)
RDW: 13.1 % (ref 11.5–15.5)
WBC: 6.8 10*3/uL (ref 4.0–10.5)
nRBC: 0 % (ref 0.0–0.2)

## 2018-03-25 LAB — URINALYSIS, ROUTINE W REFLEX MICROSCOPIC
Bilirubin Urine: NEGATIVE
Glucose, UA: NEGATIVE mg/dL
Hgb urine dipstick: NEGATIVE
Ketones, ur: NEGATIVE mg/dL
Leukocytes, UA: NEGATIVE
Nitrite: NEGATIVE
Protein, ur: NEGATIVE mg/dL
Specific Gravity, Urine: 1.021 (ref 1.005–1.030)
pH: 7 (ref 5.0–8.0)

## 2018-03-25 LAB — PREGNANCY, URINE: Preg Test, Ur: NEGATIVE

## 2018-03-25 LAB — LIPASE, BLOOD: Lipase: 32 U/L (ref 11–51)

## 2018-03-25 MED ORDER — IOHEXOL 300 MG/ML  SOLN
30.0000 mL | Freq: Once | INTRAMUSCULAR | Status: AC | PRN
  Administered 2018-03-25: 30 mL via ORAL

## 2018-03-25 MED ORDER — IOPAMIDOL (ISOVUE-300) INJECTION 61%
100.0000 mL | Freq: Once | INTRAVENOUS | Status: AC | PRN
  Administered 2018-03-25: 100 mL via INTRAVENOUS

## 2018-03-25 MED ORDER — IBUPROFEN 800 MG PO TABS: 800.0000 mg | ORAL_TABLET | Freq: Three times a day (TID) | ORAL | 0 refills | Status: DC | PRN

## 2018-03-25 MED ORDER — IOPAMIDOL (ISOVUE-300) INJECTION 61%
INTRAVENOUS | Status: AC
  Filled 2018-03-25: qty 100

## 2018-03-25 MED ORDER — SODIUM CHLORIDE 0.9 % IV BOLUS
1000.0000 mL | Freq: Once | INTRAVENOUS | Status: AC
Start: 2018-03-25 — End: 2018-03-25
  Administered 2018-03-25: 1000 mL via INTRAVENOUS

## 2018-03-25 MED ORDER — PROMETHAZINE HCL 25 MG PO TABS: 25.0000 mg | ORAL_TABLET | Freq: Four times a day (QID) | ORAL | 0 refills | Status: DC | PRN

## 2018-03-25 MED ORDER — ONDANSETRON HCL 4 MG/2ML IJ SOLN
4.0000 mg | Freq: Once | INTRAMUSCULAR | Status: AC
  Administered 2018-03-25: 4 mg via INTRAVENOUS
  Filled 2018-03-25: qty 2

## 2018-03-25 NOTE — ED Triage Notes (Signed)
Reports lower abdominal pain with N/V/D since last Thursday.  Reports menstrual cycle is 2 weeks late.  States that she took a home pregnancy test which was negative.

## 2018-03-25 NOTE — Discharge Instructions (Addendum)
Follow-up with the GI doctor provided.  Also he can follow-up with your GYN doctor.  Return here for any worsening in your condition.  Your CT scan did not show any abnormalities.  Your laboratory testing looked normal as well.

## 2018-03-25 NOTE — ED Provider Notes (Signed)
[facility]-EMERGENCY DEPT Provider Note   CSN: [id number] Arrival date & time: 03/25/18  1425     History   Chief Complaint Chief Complaint  Patient presents with  . Abdominal Pain    HPI Carrie Conner is a 27 y.o. female.  HPI Patient presents to the emergency department with 2-week history of abdominal discomfort.  The patient states she started vomiting about 7 days ago as well.  Patient states that the pain seems to go across to both sides of her abdomen.  Pain seems to be from about the umbilicus down.  Patient states that she did not take any medications prior to arrival for symptoms.  Patient denies any vaginal bleeding or discharge.  The patient denies chest pain, shortness of breath, headache,blurred vision, neck pain, fever, cough, weakness, numbness, dizziness, anorexia, edema,diarrhea, rash, back pain, dysuria, hematemesis, bloody stool, near syncope, or syncope. History reviewed. No pertinent past medical history.  There are no active problems to display for this patient.   Past Surgical History:  Procedure Laterality Date  . CESAREAN SECTION       OB History   None      Home Medications    Prior to Admission medications   Medication Sig Start Date End Date Taking? Authorizing Provider  cetirizine-pseudoephedrine (ZYRTEC-D) 5-120 MG tablet Take 1 tablet by mouth daily. Patient not taking: Reported on 03/25/2018 12/24/17   Linus Mako B, NP  ipratropium (ATROVENT) 0.06 % nasal spray Place 2 sprays into both nostrils 4 (four) times daily. Patient not taking: Reported on 03/25/2018 12/24/17   Georgetta Haber, NP    Family History History reviewed. No pertinent family history.  Social History Social History   Tobacco Use  . Smoking status: Former Games developer  . Smokeless tobacco: Never Used  Substance Use Topics  . Alcohol use: No  . Drug use: Yes    Types: Marijuana     Allergies   Patient has no known allergies.   Review of  Systems Review of Systems All other systems negative except as documented in the HPI. All pertinent positives and negatives as reviewed in the HPI.  Physical Exam Updated Vital Signs BP 112/76   Pulse 67   Temp 98.4 F (36.9 C) (Oral)   Resp 18   Ht 5\' 4"  (1.626 m)   Wt 50.3 kg   LMP 02/19/2018 (Approximate) Comment: preg test neg  SpO2 100%   BMI 19.05 kg/m   Physical Exam  Constitutional: She is oriented to person, place, and time. She appears well-developed and well-nourished. No distress.  HENT:  Head: Normocephalic and atraumatic.  Mouth/Throat: Oropharynx is clear and moist.  Eyes: Pupils are equal, round, and reactive to light.  Neck: Normal range of motion. Neck supple.  Cardiovascular: Normal rate, regular rhythm and normal heart sounds. Exam reveals no gallop and no friction rub.  No murmur heard. Pulmonary/Chest: Effort normal and breath sounds normal. No respiratory distress. She has no wheezes.  Abdominal: Soft. Bowel sounds are normal. She exhibits no distension and no mass. There is tenderness in the right lower quadrant, periumbilical area, suprapubic area and left lower quadrant. There is no rigidity and no guarding. No hernia.    Neurological: She is alert and oriented to person, place, and time. She exhibits normal muscle tone. Coordination normal.  Skin: Skin is warm and dry. Capillary refill takes less than 2 seconds. No rash noted. No erythema.  Psychiatric: She has a normal mood and affect. Her  behavior is normal.  Nursing note and vitals reviewed.    ED Treatments / Results  Labs (all labs ordered are listed, but only abnormal results are displayed) Labs Reviewed  URINALYSIS, ROUTINE W REFLEX MICROSCOPIC - Abnormal; Notable for the following components:      Result Value   APPearance CLOUDY (*)    All other components within normal limits  COMPREHENSIVE METABOLIC PANEL  LIPASE, BLOOD  CBC WITH DIFFERENTIAL/PLATELET  PREGNANCY, URINE     EKG None  Radiology Ct Abdomen Pelvis W Contrast  Result Date: 03/25/2018 CLINICAL DATA:  Right lower quadrant abdominal pain, nausea, vomiting and diarrhea for the past week. The patient reports her menstrual cycle is 2 weeks late. She reports a negative home pregnancy test. EXAM: CT ABDOMEN AND PELVIS WITH CONTRAST TECHNIQUE: Multidetector CT imaging of the abdomen and pelvis was performed using the standard protocol following bolus administration of intravenous contrast. CONTRAST:  30mL OMNIPAQUE IOHEXOL 300 MG/ML SOLN, 100mL ISOVUE-300 IOPAMIDOL (ISOVUE-300) INJECTION 61% COMPARISON:  None. FINDINGS: Lower chest: Clear lung bases. Hepatobiliary: 2 small liver cysts. No gallstones, gallbladder wall thickening, or biliary dilatation. Pancreas: Unremarkable. No pancreatic ductal dilatation or surrounding inflammatory changes. Spleen: Normal in size without focal abnormality. Adrenals/Urinary Tract: Adrenal glands are unremarkable. Kidneys are normal, without renal calculi, focal lesion, or hydronephrosis. Bladder is unremarkable. Stomach/Bowel: Stomach is within normal limits. The appendix is elongated and visualized in its entirety with no evidence of appendicitis. No evidence of bowel wall thickening, distention, or inflammatory changes. Vascular/Lymphatic: No significant vascular findings are present. No enlarged abdominal or pelvic lymph nodes. Reproductive: Uterus and bilateral adnexa are unremarkable. Other: No abdominal wall hernia or abnormality. No abdominopelvic ascites. Musculoskeletal: Normal appearing bones. IMPRESSION: Normal examination. Electronically Signed   By: Beckie SaltsSteven  Reid M.D.   On: 03/25/2018 19:09    Procedures Procedures (including critical care time)  Medications Ordered in ED Medications  iopamidol (ISOVUE-300) 61 % injection (has no administration in time range)  sodium chloride 0.9 % bolus 1,000 mL (1,000 mLs Intravenous New Bag/Given 03/25/18 1557)  ondansetron  (ZOFRAN) injection 4 mg (4 mg Intravenous Given 03/25/18 1554)  iohexol (OMNIPAQUE) 300 MG/ML solution 30 mL (30 mLs Oral Contrast Given 03/25/18 1849)  iopamidol (ISOVUE-300) 61 % injection 100 mL (100 mLs Intravenous Contrast Given 03/25/18 1850)     Initial Impression / Assessment and Plan / ED Course  I have reviewed the triage vital signs and the nursing notes.  Pertinent labs & imaging results that were available during my care of the patient were reviewed by me and considered in my medical decision making (see chart for details).    At this point I do not find any acute abnormalities on her CT scan or laboratory testing.  Patient will be given symptomatic treatment and advised to return here as needed.  Patient agrees to this plan and all questions were answered.  The patient states the vomiting is been intermittent and not overly consistent.  Final Clinical Impressions(s) / ED Diagnoses   Final diagnoses:  None    ED Discharge Orders    None       Charlestine NightLawyer, Kaylaann Mountz, Cordelia Poche-C 03/25/18 [id number]    Pfeiffer, Marcy, MD 03/25/18 2154

## 2018-08-24 ENCOUNTER — Other Ambulatory Visit: Payer: Self-pay

## 2018-08-24 ENCOUNTER — Encounter (HOSPITAL_COMMUNITY): Payer: Self-pay

## 2018-08-24 ENCOUNTER — Ambulatory Visit (HOSPITAL_COMMUNITY)
Admission: EM | Admit: 2018-08-24 | Discharge: 2018-08-24 | Disposition: A | Discharge status: Home / Self Care | Payer: Medicaid Other | Attending: Family Medicine | Admitting: Family Medicine

## 2018-08-24 DIAGNOSIS — Z3202 Encounter for pregnancy test, result negative: Secondary | ICD-10-CM

## 2018-08-24 DIAGNOSIS — N898 Other specified noninflammatory disorders of vagina: Secondary | ICD-10-CM | POA: Insufficient documentation

## 2018-08-24 DIAGNOSIS — Z713 Dietary counseling and surveillance: Secondary | ICD-10-CM

## 2018-08-24 DIAGNOSIS — Z711 Person with feared health complaint in whom no diagnosis is made: Secondary | ICD-10-CM | POA: Diagnosis present

## 2018-08-24 DIAGNOSIS — Z113 Encounter for screening for infections with a predominantly sexual mode of transmission: Secondary | ICD-10-CM | POA: Diagnosis not present

## 2018-08-24 LAB — POCT URINALYSIS DIP (DEVICE)
Bilirubin Urine: NEGATIVE
Glucose, UA: NEGATIVE mg/dL
Hgb urine dipstick: NEGATIVE
Leukocytes,Ua: NEGATIVE
Nitrite: NEGATIVE
Protein, ur: 30 mg/dL — AB
Specific Gravity, Urine: 1.02 (ref 1.005–1.030)
Urobilinogen, UA: 0.2 mg/dL (ref 0.0–1.0)
pH: 7.5 (ref 5.0–8.0)

## 2018-08-24 LAB — POCT PREGNANCY, URINE: Preg Test, Ur: NEGATIVE

## 2018-08-24 MED ORDER — AZITHROMYCIN 250 MG PO TABS
1000.0000 mg | ORAL_TABLET | Freq: Once | ORAL | Status: AC
  Administered 2018-08-24: 1000 mg via ORAL

## 2018-08-24 MED ORDER — AZITHROMYCIN 250 MG PO TABS
ORAL_TABLET | ORAL | Status: AC
  Filled 2018-08-24: qty 4

## 2018-08-24 MED ORDER — CEFTRIAXONE SODIUM 250 MG IJ SOLR
250.0000 mg | Freq: Once | INTRAMUSCULAR | Status: AC
  Administered 2018-08-24: 250 mg via INTRAMUSCULAR

## 2018-08-24 MED ORDER — LIDOCAINE HCL (PF) 1 % IJ SOLN
INTRAMUSCULAR | Status: AC
  Filled 2018-08-24: qty 2

## 2018-08-24 MED ORDER — CEFTRIAXONE SODIUM 250 MG IJ SOLR
INTRAMUSCULAR | Status: AC
  Filled 2018-08-24: qty 250

## 2018-08-24 NOTE — ED Triage Notes (Signed)
Pt has had abdominal cramps, and reports odors discharge

## 2018-08-24 NOTE — ED Provider Notes (Signed)
[facility]   [id number] 08/24/18 Arrival Time: 1538   AN:VBTYOMA odor  SUBJECTIVE:  Carrie Conner is a 28 y.o. female who presents with complaints of vaginal odor x 3 days.  Was sexually active prior to symptoms.  Patient has been sexually active with 1 female partner.  Denies vaginal discharge. She has NOT tried OTC medications.  She reports similar symptoms in the past and was diagnosed with chlamydia.  Reports mild abdominal cramping with intercourse 3 days ago.  Had intercourse yesterday without cramping.  She denies fever, chills, nausea, vomiting, abdominal or pelvic pain, urinary symptoms, vaginal itching, vaginal discharge, vaginal bleeding, dyspareunia, vaginal rashes or lesions.   Patient's last menstrual period was 08/16/2018. Current birth control method: None  ROS: As per HPI.  History reviewed. No pertinent past medical history. Past Surgical History:  Procedure Laterality Date  . CESAREAN SECTION     No Known Allergies No current facility-administered medications on file prior to encounter.    Current Outpatient Medications on File Prior to Encounter  Medication Sig Dispense Refill  . ibuprofen (ADVIL,MOTRIN) 800 MG tablet Take 1 tablet (800 mg total) by mouth every 8 (eight) hours as needed. 21 tablet 0    Social History   Socioeconomic History  . Marital status: Single    Spouse name: Not on file  . Number of children: Not on file  . Years of education: Not on file  . Highest education level: Not on file  Occupational History  . Not on file  Social Needs  . Financial resource strain: Not on file  . Food insecurity:    Worry: Not on file    Inability: Not on file  . Transportation needs:    Medical: Not on file    Non-medical: Not on file  Tobacco Use  . Smoking status: Former Games developer  . Smokeless tobacco: Never Used  Substance and Sexual Activity  . Alcohol use: No  . Drug use: Yes    Types: Marijuana  . Sexual activity: Yes    Birth  control/protection: None  Lifestyle  . Physical activity:    Days per week: Not on file    Minutes per session: Not on file  . Stress: Not on file  Relationships  . Social connections:    Talks on phone: Not on file    Gets together: Not on file    Attends religious service: Not on file    Active member of club or organization: Not on file    Attends meetings of clubs or organizations: Not on file    Relationship status: Not on file  . Intimate partner violence:    Fear of current or ex partner: Not on file    Emotionally abused: Not on file    Physically abused: Not on file    Forced sexual activity: Not on file  Other Topics Concern  . Not on file  Social History Narrative  . Not on file   History reviewed. No pertinent family history.  OBJECTIVE:  Vitals:   08/24/18 1558  BP: 103/69  Pulse: 79  Resp: 18  Temp: 98 F (36.7 C)  SpO2: 100%     General appearance: Alert, NAD, appears stated age Head: NCAT Throat: lips, mucosa, and tongue normal; teeth and gums normal Lungs: CTA bilaterally without adventitious breath sounds Heart: regular rate and rhythm.  Radial pulses 2+ symmetrical bilaterally Back: no CVA tenderness Abdomen: soft, non-tender; bowel sounds normal; no masses or organomegaly; no guarding  or rebound tenderness GU: declines OR External examination without vulvar lesions or erythema Bimanual exam: Negative for cervical motion or adenexal tenderness; Speculum exam: Thick white/yellow discharge during pelvic exam.  Cervix visualized without erythema or lesions. Cervical swab obtained Skin: warm and dry Psychological:  Alert and cooperative. Normal mood and affect.  LABS:   Results for orders placed or performed during the hospital encounter of 08/24/18 (from the past 24 hour(s))  POCT urinalysis dip (device)     Status: Abnormal   Collection Time: 08/24/18  4:15 PM  Result Value Ref Range   Glucose, UA NEGATIVE NEGATIVE mg/dL   Bilirubin Urine  NEGATIVE NEGATIVE   Ketones, ur TRACE (A) NEGATIVE mg/dL   Specific Gravity, Urine 1.020 1.005 - 1.030   Hgb urine dipstick NEGATIVE NEGATIVE   pH 7.5 5.0 - 8.0   Protein, ur 30 (A) NEGATIVE mg/dL   Urobilinogen, UA 0.2 0.0 - 1.0 mg/dL   Nitrite NEGATIVE NEGATIVE   Leukocytes,Ua NEGATIVE NEGATIVE  Pregnancy, urine POC     Status: None   Collection Time: 08/24/18  4:25 PM  Result Value Ref Range   Preg Test, Ur NEGATIVE NEGATIVE     ASSESSMENT & PLAN:  1. Vaginal odor   2. Concern about STD in female without diagnosis     Meds ordered this encounter  Medications  . cefTRIAXone (ROCEPHIN) injection 250 mg    Order Specific Question:   Antibiotic Indication:    Answer:   STD  . azithromycin (ZITHROMAX) tablet 1,000 mg    Pending: Labs Reviewed  POCT URINALYSIS DIP (DEVICE) - Abnormal; Notable for the following components:      Result Value   Ketones, ur TRACE (*)    Protein, ur 30 (*)    All other components within normal limits  RPR  HIV ANTIBODY (ROUTINE TESTING W REFLEX)  POC URINE PREG, ED  POCT PREGNANCY, URINE  CERVICOVAGINAL ANCILLARY ONLY    Vaginal self-swab obtained Given rocephin 250mg  injection and azithromycin 1g in office HIV/ syphilis testing today We will follow up with you regarding the results of your test If tests are positive, please abstain from sexual activity for at least 7 days and notify partners Follow up with PCP or with Joaquin CourtsKimberly Harris FNP if symptoms persists Return here or go to ER if you have any new or worsening symptoms fever, chills, nausea, vomiting, abdominal or pelvic pain, painful intercourse, vaginal discharge, vaginal bleeding, persistent symptoms despite treatment, etc...  Reviewed expectations re: course of current medical issues. Questions answered. Outlined signs and symptoms indicating need for more acute intervention. Patient verbalized understanding. After Visit Summary given.       Rennis HardingWurst, Coye Dawood, PA-C 08/24/18  [phone]

## 2018-08-24 NOTE — Discharge Instructions (Signed)
Vaginal self-swab obtained Given rocephin 250mg  injection and azithromycin 1g in office HIV/ syphilis testing today We will follow up with you regarding the results of your test If tests are positive, please abstain from sexual activity for at least 7 days and notify partners Follow up with PCP or with Carrie Courts FNP if symptoms persists Return here or go to ER if you have any new or worsening symptoms fever, chills, nausea, vomiting, abdominal or pelvic pain, painful intercourse, vaginal discharge, vaginal bleeding, persistent symptoms despite treatment, etc..Marland Kitchen

## 2018-08-25 LAB — CERVICOVAGINAL ANCILLARY ONLY
Bacterial vaginitis: POSITIVE — AB
Candida vaginitis: POSITIVE — AB
Chlamydia: NEGATIVE
Neisseria Gonorrhea: NEGATIVE
Trichomonas: NEGATIVE

## 2018-08-25 LAB — HIV ANTIBODY (ROUTINE TESTING W REFLEX): HIV Screen 4th Generation wRfx: NONREACTIVE

## 2018-08-25 LAB — RPR: RPR Ser Ql: NONREACTIVE

## 2018-08-26 ENCOUNTER — Telehealth (HOSPITAL_COMMUNITY): Payer: Self-pay | Admitting: Emergency Medicine

## 2018-08-26 MED ORDER — METRONIDAZOLE 500 MG PO TABS: 500.0000 mg | ORAL_TABLET | Freq: Two times a day (BID) | ORAL | 0 refills | Status: AC

## 2018-08-26 MED ORDER — FLUCONAZOLE 150 MG PO TABS: 150.0000 mg | ORAL_TABLET | Freq: Once | ORAL | 0 refills | Status: AC

## 2018-08-26 NOTE — Telephone Encounter (Signed)
Bacterial vaginosis is positive. This was not treated at the urgent care visit.  Flagyl 500 mg BID x 7 days #14 no refills sent to patients pharmacy of choice.    Test for candida (yeast) was positive.  Prescription for fluconazole 150mg po now, repeat dose in 3d if needed, #2 no refills, sent to the pharmacy of record.  Recheck or followup with PCP for further evaluation if symptoms are not improving.    Patient contacted and made aware of all results, all questions answered.   

## 2018-11-02 ENCOUNTER — Other Ambulatory Visit (HOSPITAL_COMMUNITY)
Admission: RE | Admit: 2018-11-02 | Discharge: 2018-11-02 | Disposition: A | Discharge status: Home / Self Care | Payer: Medicaid Other | Source: Ambulatory Visit | Attending: Family Medicine | Admitting: Family Medicine

## 2018-11-02 ENCOUNTER — Other Ambulatory Visit: Payer: Self-pay | Admitting: Family Medicine

## 2018-11-02 DIAGNOSIS — N898 Other specified noninflammatory disorders of vagina: Secondary | ICD-10-CM | POA: Insufficient documentation

## 2018-11-05 LAB — URINE CYTOLOGY ANCILLARY ONLY
Bacterial vaginitis: NEGATIVE
Candida vaginitis: NEGATIVE
Chlamydia: NEGATIVE
Neisseria Gonorrhea: NEGATIVE
Trichomonas: POSITIVE — AB

## 2019-01-30 ENCOUNTER — Other Ambulatory Visit: Payer: Self-pay

## 2019-01-30 ENCOUNTER — Ambulatory Visit (HOSPITAL_COMMUNITY)
Admission: EM | Admit: 2019-01-30 | Discharge: 2019-01-30 | Disposition: A | Discharge status: Home / Self Care | Payer: Medicaid Other | Attending: Family Medicine | Admitting: Family Medicine

## 2019-01-30 DIAGNOSIS — Z20828 Contact with and (suspected) exposure to other viral communicable diseases: Secondary | ICD-10-CM | POA: Diagnosis not present

## 2019-01-30 DIAGNOSIS — H6691 Otitis media, unspecified, right ear: Secondary | ICD-10-CM | POA: Diagnosis not present

## 2019-01-30 LAB — POCT RAPID STREP A: Streptococcus, Group A Screen (Direct): NEGATIVE

## 2019-01-30 MED ORDER — AMOXICILLIN 500 MG PO CAPS: 500.0000 mg | ORAL_CAPSULE | Freq: Three times a day (TID) | ORAL | 0 refills | Status: AC

## 2019-01-30 NOTE — ED Triage Notes (Signed)
Pt sts sore throat and ear pain 

## 2019-01-30 NOTE — Discharge Instructions (Signed)
Take the antibiotic amoxicillin as directed.  Take Tylenol or ibuprofen as needed for your discomfort.    Your COVID test is pending.  You should self quarantine until your test result is back and is negative.    Go to the emergency department if you develop increased pain, high fever, difficulty swallowing, shortness of breath, severe diarrhea, or other concerning symptoms.

## 2019-01-30 NOTE — ED Provider Notes (Signed)
[facility]    CSN: [id number] Arrival date & time: 01/30/19  1533      History   Chief Complaint Chief Complaint  Patient presents with  . Sore Throat    HPI Carrie Conner is a 28 y.o. female.   Patient presents with right ear pain and sore throat x2 days.  She denies fever, chills, difficulty swallowing, cough, shortness of breath, vomiting, diarrhea, rash, or other symptoms.  No pertinent medical history.  No treatments attempted at home.  The history is provided by the patient.    No past medical history on file.  There are no active problems to display for this patient.   Past Surgical History:  Procedure Laterality Date  . CESAREAN SECTION      OB History   No obstetric history on file.      Home Medications    Prior to Admission medications   Medication Sig Start Date End Date Taking? Authorizing Provider  amoxicillin (AMOXIL) 500 MG capsule Take 1 capsule (500 mg total) by mouth 3 (three) times daily for 10 days. 01/30/19 02/09/19  Sharion Balloon, NP  ibuprofen (ADVIL,MOTRIN) 800 MG tablet Take 1 tablet (800 mg total) by mouth every 8 (eight) hours as needed. 03/25/18   Dalia Heading, PA-C    Family History No family history on file.  Social History Social History   Tobacco Use  . Smoking status: Former Research scientist (life sciences)  . Smokeless tobacco: Never Used  Substance Use Topics  . Alcohol use: No  . Drug use: Yes    Types: Marijuana     Allergies   Patient has no known allergies.   Review of Systems Review of Systems  Constitutional: Negative for chills and fever.  HENT: Positive for ear pain and sore throat. Negative for trouble swallowing.   Eyes: Negative for pain and visual disturbance.  Respiratory: Negative for cough and shortness of breath.   Cardiovascular: Negative for chest pain and palpitations.  Gastrointestinal: Negative for abdominal pain, diarrhea and vomiting.  Genitourinary: Negative for dysuria and hematuria.   Musculoskeletal: Negative for arthralgias and back pain.  Skin: Negative for color change and rash.  Neurological: Negative for seizures and syncope.  All other systems reviewed and are negative.    Physical Exam Triage Vital Signs ED Triage Vitals  Enc Vitals Group     BP      Pulse      Resp      Temp      Temp src      SpO2      Weight      Height      Head Circumference      Peak Flow      Pain Score      Pain Loc      Pain Edu?      Excl. in [place]?    No data found.  Updated Vital Signs BP 120/78 (BP Location: Right Arm)   Pulse 99   Temp 99.5 F (37.5 C) (Oral)   Resp 18   SpO2 99%   Visual Acuity Right Eye Distance:   Left Eye Distance:   Bilateral Distance:    Right Eye Near:   Left Eye Near:    Bilateral Near:     Physical Exam Vitals signs and nursing note reviewed.  Constitutional:      General: She is not in acute distress.    Appearance: She is well-developed.  HENT:     Head:  Normocephalic and atraumatic.     Right Ear: Tympanic membrane is erythematous.     Left Ear: Tympanic membrane normal.     Nose: Rhinorrhea present.     Mouth/Throat:     Mouth: Mucous membranes are moist.     Pharynx: Posterior oropharyngeal erythema present.  Eyes:     Conjunctiva/sclera: Conjunctivae normal.  Neck:     Musculoskeletal: Neck supple.  Cardiovascular:     Rate and Rhythm: Normal rate and regular rhythm.     Heart sounds: No murmur.  Pulmonary:     Effort: Pulmonary effort is normal. No respiratory distress.     Breath sounds: Normal breath sounds.  Abdominal:     General: Bowel sounds are normal.     Palpations: Abdomen is soft.     Tenderness: There is no abdominal tenderness. There is no guarding or rebound.  Skin:    General: Skin is warm and dry.     Findings: No rash.  Neurological:     Mental Status: She is alert.  Psychiatric:        Mood and Affect: Mood normal.        Behavior: Behavior normal.      UC Treatments / Results   Labs (all labs ordered are listed, but only abnormal results are displayed) Labs Reviewed  NOVEL CORONAVIRUS, NAA (HOSP ORDER, SEND-OUT TO REF LAB; TAT 18-24 HRS)  CULTURE, GROUP A STREP [facility])  POCT RAPID STREP A    EKG   Radiology No results found.  Procedures Procedures (including critical care time)  Medications Ordered in UC Medications - No data to display  Initial Impression / Assessment and Plan / UC Course  I have reviewed the triage vital signs and the nursing notes.  Pertinent labs & imaging results that were available during my care of the patient were reviewed by me and considered in my medical decision making (see chart for details).    Right otitis media.  Treating with amoxicillin.  Rapid strep negative; culture pending.  COVID test performed here.  Instructed patient to self quarantine until her test results are back.  Instructed patient to go to the emergency department if she develops increased pain, high fever, difficulty swallowing, shortness of breath, severe diarrhea, or other concerning symptoms.  Patient agrees with plan of care.   Final Clinical Impressions(s) / UC Diagnoses   Final diagnoses:  Right otitis media, unspecified otitis media type     Discharge Instructions     Take the antibiotic amoxicillin as directed.  Take Tylenol or ibuprofen as needed for your discomfort.    Your COVID test is pending.  You should self quarantine until your test result is back and is negative.    Go to the emergency department if you develop increased pain, high fever, difficulty swallowing, shortness of breath, severe diarrhea, or other concerning symptoms.         ED Prescriptions    Medication Sig Dispense Auth. Provider   amoxicillin (AMOXIL) 500 MG capsule Take 1 capsule (500 mg total) by mouth 3 (three) times daily for 10 days. 30 capsule Mickie Bail, NP     PDMP not reviewed this encounter.   Mickie Bail, NP 01/30/19 [phone]

## 2019-01-31 LAB — NOVEL CORONAVIRUS, NAA (HOSP ORDER, SEND-OUT TO REF LAB; TAT 18-24 HRS): SARS-CoV-2, NAA: NOT DETECTED

## 2019-02-02 LAB — CULTURE, GROUP A STREP (THRC)

## 2019-02-10 ENCOUNTER — Other Ambulatory Visit (HOSPITAL_COMMUNITY)
Admission: RE | Admit: 2019-02-10 | Discharge: 2019-02-10 | Disposition: A | Discharge status: Home / Self Care | Payer: Medicaid Other | Source: Ambulatory Visit | Attending: Nurse Practitioner | Admitting: Nurse Practitioner

## 2019-02-10 DIAGNOSIS — B373 Candidiasis of vulva and vagina: Secondary | ICD-10-CM | POA: Insufficient documentation

## 2019-02-17 ENCOUNTER — Ambulatory Visit (HOSPITAL_COMMUNITY)
Admission: RE | Admit: 2019-02-17 | Discharge: 2019-02-17 | Disposition: A | Discharge status: Home / Self Care | Payer: Medicaid Other | Attending: Psychiatry | Admitting: Psychiatry

## 2019-02-17 LAB — MOLECULAR ANCILLARY ONLY
Bacterial Vaginitis (gardnerella): POSITIVE — AB
Candida Glabrata: NEGATIVE
Candida Vaginitis: NEGATIVE
Chlamydia: NEGATIVE
Comment: NEGATIVE
Comment: NEGATIVE
Comment: NEGATIVE
Comment: NORMAL
Trichomonas: NEGATIVE

## 2019-02-17 LAB — MOLECULAR ANCILLARY ONLY???
Comment: NEGATIVE
Comment: NEGATIVE
Neisseria Gonorrhea: NEGATIVE

## 2019-02-17 NOTE — H&P (Signed)
Behavioral Health Medical Screening Exam  Carrie Conner is an 28 y.o. female. Patient presents voluntarily for walk-in assessment. Patient states "I just need a letter for clearance for my cosmetic surgery scheduled for next Tuesday." Patient denies suicidal and homicidal ideations. Patient has history of suicide attempt at age 11, took Benadryl overdose. Patient has history of Bipolar Disorder, no current outpatient provider, no current medications. Patient encouraged to seek outpatient provider. Patient currently denies any psychiatric symptoms.   Total Time spent with patient: 20 minutes  Psychiatric Specialty Exam: Physical Exam  Nursing note and vitals reviewed. Constitutional: She is oriented to person, place, and time. She appears well-developed.  HENT:  Head: Normocephalic.  Cardiovascular: Normal rate.  Respiratory: Effort normal.  Neurological: She is alert and oriented to person, place, and time.  Psychiatric: She has a normal mood and affect. Her behavior is normal. Judgment and thought content normal.    Review of Systems  Constitutional: Negative.   HENT: Negative.   Eyes: Negative.   Respiratory: Negative.   Cardiovascular: Negative.   Gastrointestinal: Negative.   Genitourinary: Negative.   Musculoskeletal: Negative.   Skin: Negative.   Neurological: Negative.   Endo/Heme/Allergies: Negative.   Psychiatric/Behavioral: Negative.     Blood pressure 111/73, pulse 83, temperature 98.4 F (36.9 C), temperature source Oral, resp. rate 18, SpO2 100 %.There is no height or weight on file to calculate BMI.  General Appearance: Casual  Eye Contact:  Good  Speech:  Clear and Coherent and Normal Rate  Volume:  Normal  Mood:  Euthymic  Affect:  Appropriate and Congruent  Thought Process:  Coherent, Goal Directed and Descriptions of Associations: Intact  Orientation:  Full (Time, Place, and Person)  Thought Content:  WDL and Logical  Suicidal Thoughts:  No  Homicidal  Thoughts:  No  Memory:  Immediate;   Good Recent;   Good Remote;   Good  Judgement:  Good  Insight:  Good  Psychomotor Activity:  Normal  Concentration: Concentration: Good and Attention Span: Good  Recall:  Good  Fund of Knowledge:Good  Language: Good  Akathisia:  No  Handed:  Right  AIMS (if indicated):     Assets:  Communication Skills Desire for Improvement Financial Resources/Insurance Housing Physical Health Social Support  Sleep:       Musculoskeletal: Strength & Muscle Tone: within normal limits Gait & Station: normal Patient leans: N/A  Blood pressure 111/73, pulse 83, temperature 98.4 F (36.9 C), temperature source Oral, resp. rate 18, SpO2 100 %.  Recommendations:  Based on my evaluation the patient does not appear to have an emergency medical condition.  Discharge home, follow-up outpatient.   Emmaline Kluver, FNP 02/17/2019, 6:06 PM

## 2019-02-17 NOTE — BH Assessment (Signed)
Assessment Note  Carrie Conner is an 28 y.o. female. Pt denies SI/HI and AVH. Pt states she is scheduled for cosmetic surgery but had to have an assessment before surgery. Pt states she has been diagnosed with Bipolar disorder but she does not have any current MH concerns. Pt denies current MH treatment. Pt denies current mental health medication. Pt reports occasional marijuana use.   Otila Kluver, NP recommends D/C.  Diagnosis:  F43.20 Adjustment   Past Medical History: No past medical history on file.  Past Surgical History:  Procedure Laterality Date  . CESAREAN SECTION      Family History: No family history on file.  Social History:  reports that she has quit smoking. She has never used smokeless tobacco. She reports current drug use. Drug: Marijuana. She reports that she does not drink alcohol.  Additional Social History:  Alcohol / Drug Use Pain Medications: please see mar Prescriptions: please see mar Over the Counter: please see mar History of alcohol / drug use?: No history of alcohol / drug abuse Longest period of sobriety (when/how long): NA  CIWA: CIWA-Ar BP: 111/73 Pulse Rate: 83 COWS:    Allergies: No Known Allergies  Home Medications: (Not in a hospital admission)   OB/GYN Status:  No LMP recorded.  General Assessment Data Location of Assessment: [facility] Assessment Services TTS Assessment: In system Is this a Tele or Face-to-Face Assessment?: Face-to-Face Is this an Initial Assessment or a Re-assessment for this encounter?: Initial Assessment Patient Accompanied by:: Parent Language Other than English: No Living Arrangements: Other (Comment) What gender do you identify as?: Female Marital status: Single Maiden name: NA Pregnancy Status: No Living Arrangements: Alone Can pt return to current living arrangement?: Yes Admission Status: Voluntary Is patient capable of signing voluntary admission?: Yes Referral Source: Self/Family/Friend Insurance type:  Medicaid  Medical Screening Exam ([place]) Medical Exam completed: Yes  Crisis Care Plan Living Arrangements: Alone Name of Psychiatrist: NA Name of Therapist: NA  Education Status Is patient currently in school?: No Is the patient employed, unemployed or receiving disability?: Employed  Risk to self with the past 6 months Suicidal Ideation: No Has patient been a risk to self within the past 6 months prior to admission? : No Suicidal Intent: No Has patient had any suicidal intent within the past 6 months prior to admission? : No Is patient at risk for suicide?: No Suicidal Plan?: No Has patient had any suicidal plan within the past 6 months prior to admission? : No Access to Means: No What has been your use of drugs/alcohol within the last 12 months?: marijuana Previous Attempts/Gestures: No How many times?: 0 Other Self Harm Risks: NA Triggers for Past Attempts: None known Intentional Self Injurious Behavior: None Family Suicide History: No Recent stressful life event(s): Other (Comment) Persecutory voices/beliefs?: No Depression: No Depression Symptoms: (pt denies) Substance abuse history and/or treatment for substance abuse?: No Suicide prevention information given to non-admitted patients: Not applicable  Risk to Others within the past 6 months Homicidal Ideation: No Does patient have any lifetime risk of violence toward others beyond the six months prior to admission? : No Thoughts of Harm to Others: No Current Homicidal Intent: No Current Homicidal Plan: No Access to Homicidal Means: No Identified Victim: NA History of harm to others?: No Assessment of Violence: None Noted Violent Behavior Description: NA Does patient have access to weapons?: No Criminal Charges Pending?: No Does patient have a court date: No Is patient on probation?: No  Psychosis Hallucinations:  None noted Delusions: None noted  Mental Status Report Appearance/Hygiene:  Unremarkable Eye Contact: Fair Motor Activity: Freedom of movement Speech: Logical/coherent Level of Consciousness: Alert Mood: Euthymic Affect: Appropriate to circumstance Anxiety Level: None Thought Processes: Coherent, Relevant Judgement: Unimpaired Orientation: Place, Person, Time, Situation Obsessive Compulsive Thoughts/Behaviors: None  Cognitive Functioning Concentration: Normal Memory: Recent Intact, Remote Intact Is patient IDD: No Insight: Fair Impulse Control: Fair Appetite: Fair Have you had any weight changes? : No Change Sleep: No Change Total Hours of Sleep: 8 Vegetative Symptoms: None  ADLScreening [facility]) Patient's cognitive ability adequate to safely complete daily activities?: Yes Patient able to express need for assistance with ADLs?: Yes Independently performs ADLs?: Yes (appropriate for developmental age)  Prior Inpatient Therapy Prior Inpatient Therapy: No  Prior Outpatient Therapy Prior Outpatient Therapy: Yes Prior Therapy Dates: 2018 Prior Therapy Facilty/Provider(s): unknown Reason for Treatment: unknown Does patient have an ACCT team?: No Does patient have Intensive In-House Services?  : No Does patient have [facility] services? : No Does patient have P4CC services?: No  ADL Screening (condition at time of admission) Patient's cognitive ability adequate to safely complete daily activities?: Yes Is the patient deaf or have difficulty hearing?: No Does the patient have difficulty seeing, even when wearing glasses/contacts?: No Patient able to express need for assistance with ADLs?: Yes Does the patient have difficulty dressing or bathing?: No Independently performs ADLs?: Yes (appropriate for developmental age)       Abuse/Neglect Assessment (Assessment to be complete while patient is alone) Abuse/Neglect Assessment Can Be Completed: Yes Physical Abuse: Denies Verbal Abuse: Denies Sexual Abuse: Denies Exploitation of  patient/patient's resources: Denies     Merchant navy officer (For Healthcare) Does Patient Have a Medical Advance Directive?: No Would patient like information on creating a medical advance directive?: No - Patient declined          Disposition:  Disposition Initial Assessment Completed for this Encounter: Yes Disposition of Patient: Discharge  On Site Evaluation by:   Reviewed with Physician:    Emmit Pomfret 02/17/2019 6:35 PM

## 2019-07-05 IMAGING — CT CT ABD-PELV W/ CM
2 of 4 series · 16 of 46 positions shown, 18 images · IV contrast (omnipaque)
Comparison: None.

CLINICAL DATA: Right lower quadrant abdominal pain, nausea,
vomiting and diarrhea for the past week. The patient reports her
menstrual cycle is 2 weeks late. She reports a negative home
pregnancy test.

EXAM:
CT ABDOMEN AND PELVIS WITH CONTRAST
TECHNIQUE: Multidetector CT imaging of the abdomen and pelvis was performed
using the standard protocol following bolus administration of
intravenous contrast.
CONTRAST:  30mL OMNIPAQUE IOHEXOL 300 MG/ML SOLN, 100mL U0IKBG-3TT
IOPAMIDOL ([id number]) INJECTION 61%

[Series 2: axial st · axial · 0.68mm/px · z∈[-77,+268]mm · 13 of 79 slices shown, 15 images]
[im 5/79  soft-tissue]
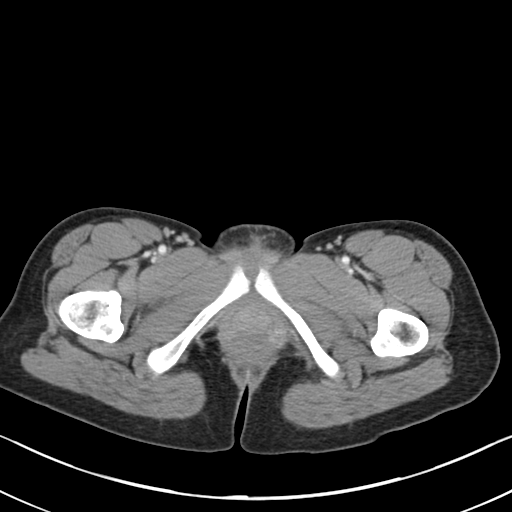
[im 5/79  bone]
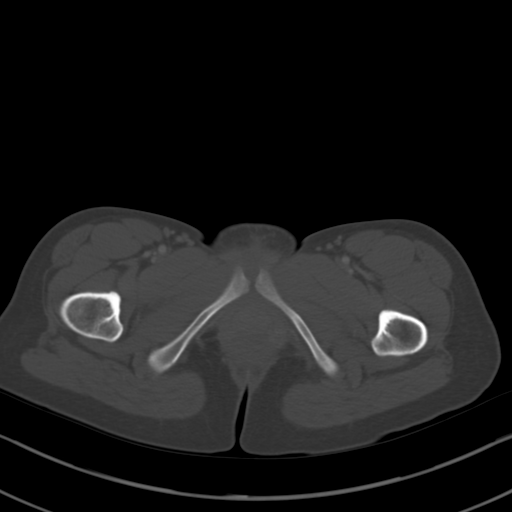
[im 9/79  soft-tissue]
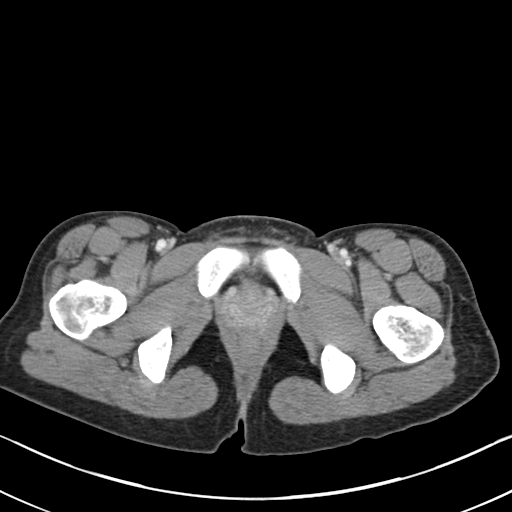
[im 18/79  soft-tissue]
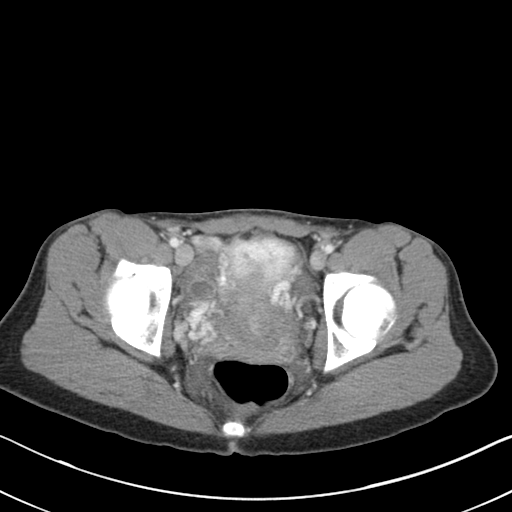
[im 22/79  soft-tissue]
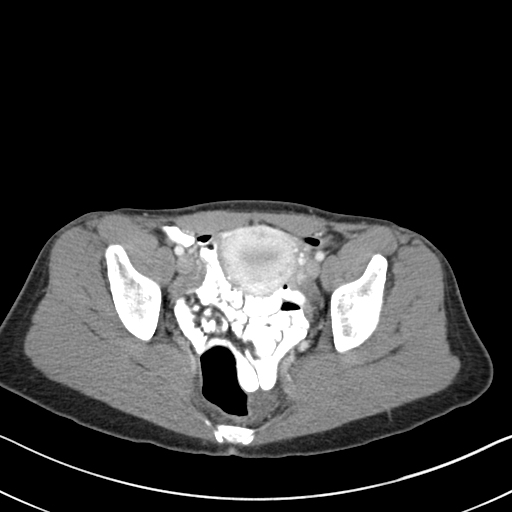
[im 27/79  soft-tissue]
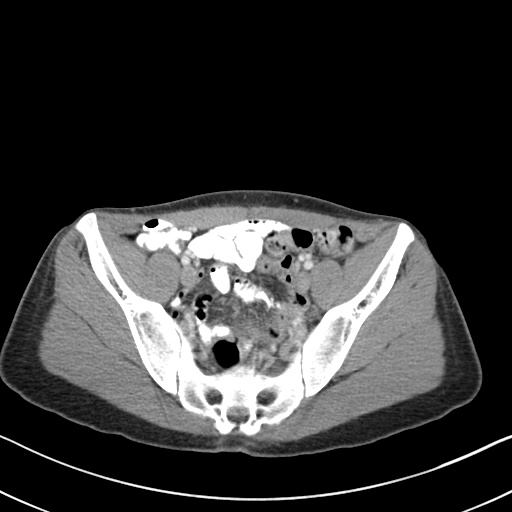
[im 35/79  soft-tissue]
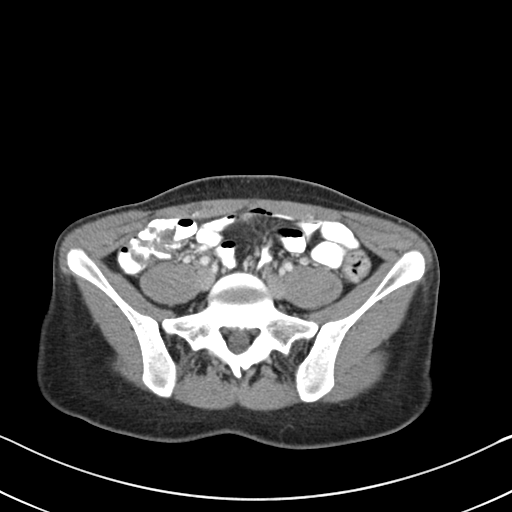
[im 40/79  soft-tissue]
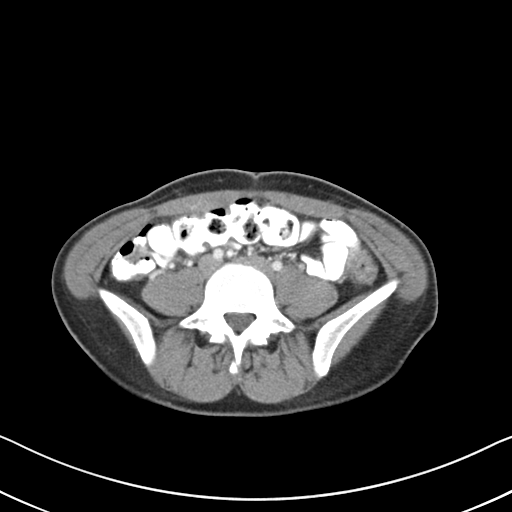
[im 44/79  soft-tissue]
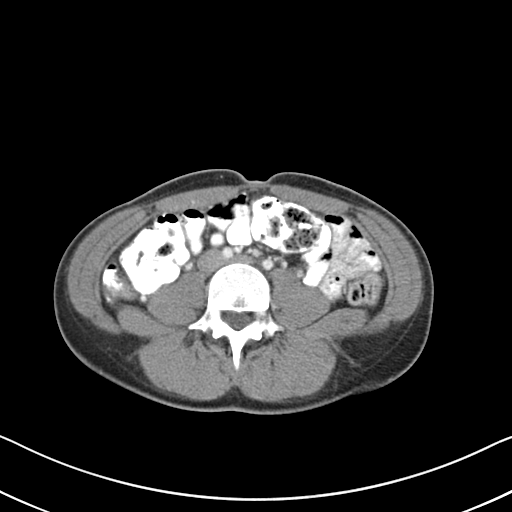
[im 53/79  soft-tissue]
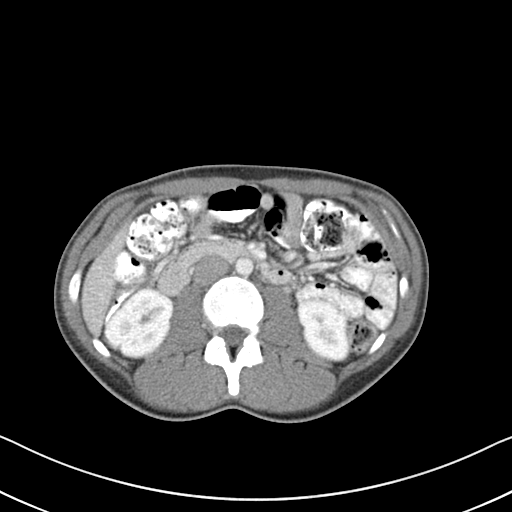
[im 53/79  bone]
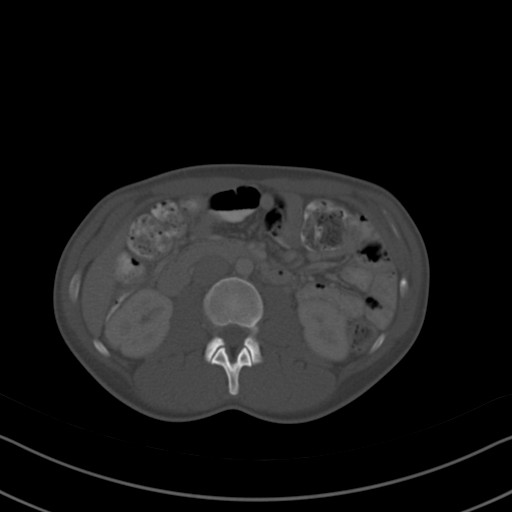
[im 57/79  soft-tissue]
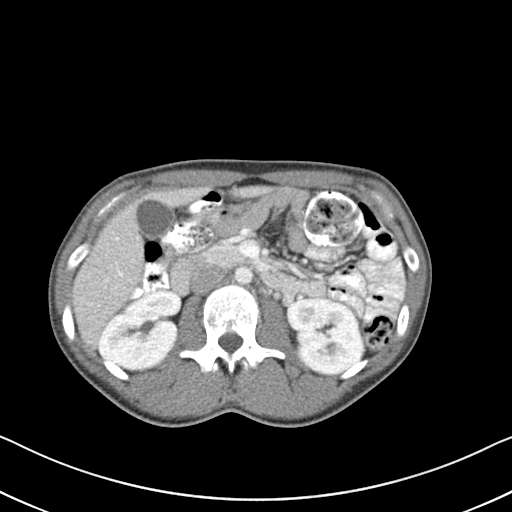
[im 61/79  soft-tissue]
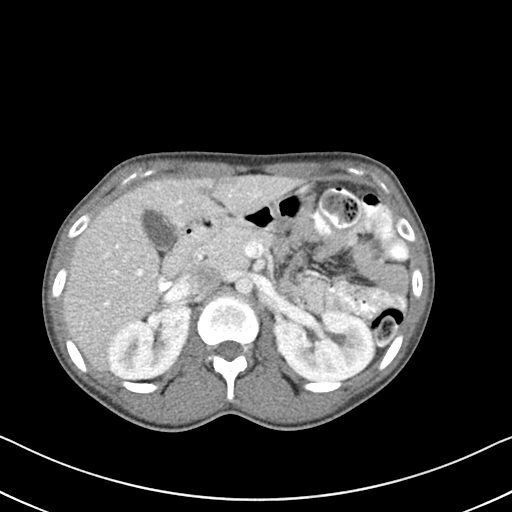
[im 70/79  soft-tissue]
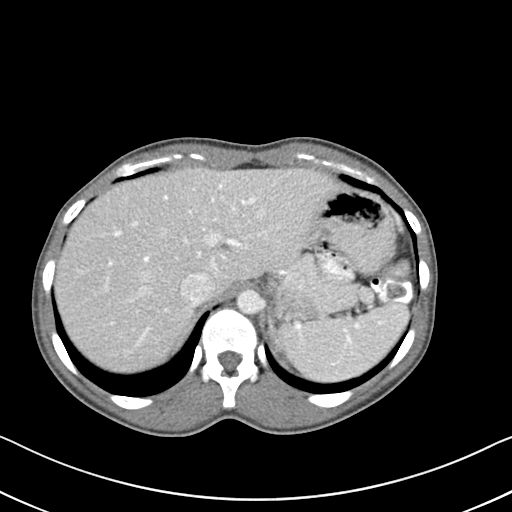
[im 74/79  soft-tissue]
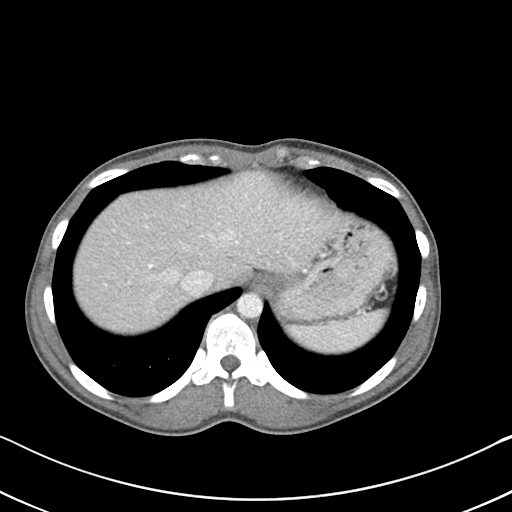

[Series 4: coronal st · coronal · 0.61mm/px · 3 of 70 slices shown]
[im 24/70  soft-tissue]
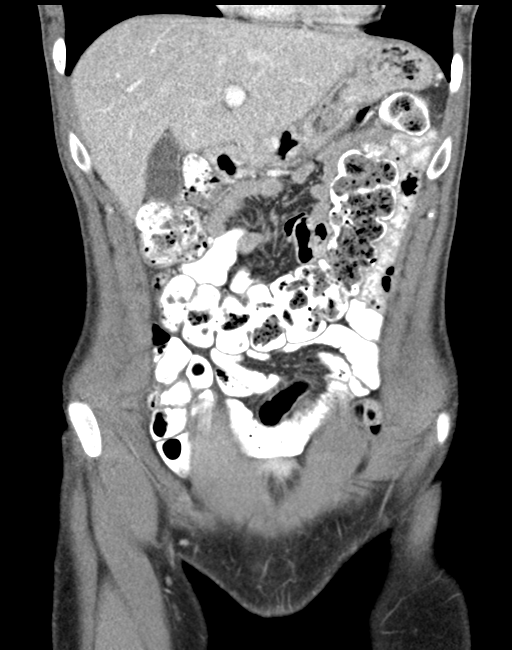
[im 31/70  soft-tissue]
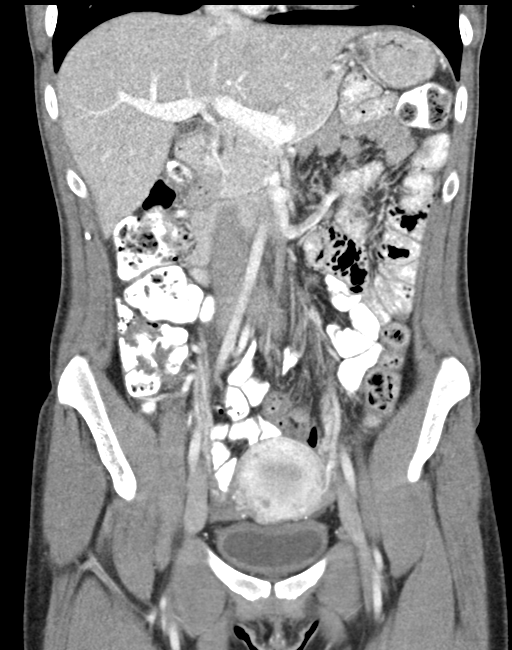
[im 39/70  soft-tissue]
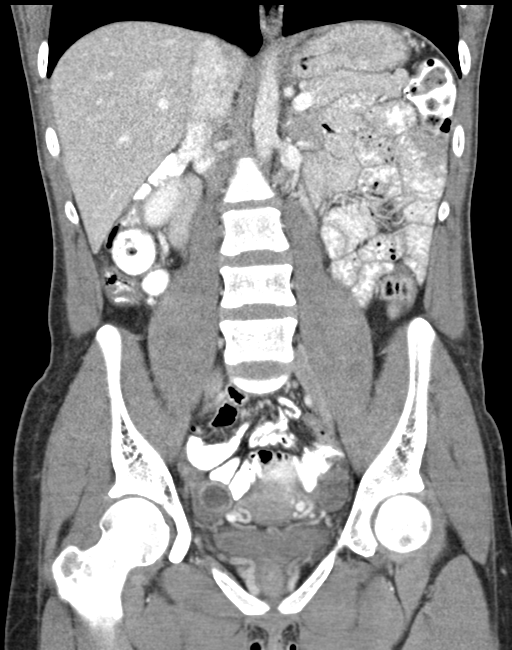

[16 of 46 positions shown; findings below may reference images not displayed]

FINDINGS: Lower chest: Clear lung bases.

Hepatobiliary: 2 small liver cysts.. No gallstones, gallbladder wall
thickening, or biliary dilatation.

Pancreas: Unremarkable. No pancreatic ductal dilatation or
surrounding inflammatory changes.

Spleen: Normal in size without focal abnormality.

Adrenals/Urinary Tract: Adrenal glands are unremarkable. Kidneys are
normal, without renal calculi, focal lesion, or hydronephrosis.
Bladder is unremarkable.

Stomach/Bowel: Stomach is within normal limits. The appendix is
elongated and visualized in its entirety with no evidence of
appendicitis. No evidence of bowel wall thickening, distention, or
inflammatory changes.

Vascular/Lymphatic: No significant vascular findings are present. No
enlarged abdominal or pelvic lymph nodes.

Reproductive: Uterus and bilateral adnexa are unremarkable.

Other: No abdominal wall hernia or abnormality. No abdominopelvic
ascites.

Musculoskeletal: Normal appearing bones.
IMPRESSION: Normal examination.

## 2019-12-19 ENCOUNTER — Emergency Department (HOSPITAL_COMMUNITY)
Admission: EM | Admit: 2019-12-19 | Discharge: 2019-12-19 | Disposition: A | Discharge status: Home / Self Care | Payer: Medicaid Other | Attending: Emergency Medicine | Admitting: Emergency Medicine

## 2019-12-19 ENCOUNTER — Encounter (HOSPITAL_COMMUNITY): Payer: Self-pay

## 2019-12-19 DIAGNOSIS — H9201 Otalgia, right ear: Secondary | ICD-10-CM | POA: Diagnosis present

## 2019-12-19 DIAGNOSIS — H60501 Unspecified acute noninfective otitis externa, right ear: Secondary | ICD-10-CM

## 2019-12-19 DIAGNOSIS — H6091 Unspecified otitis externa, right ear: Secondary | ICD-10-CM | POA: Diagnosis not present

## 2019-12-19 MED ORDER — NEOMYCIN-POLYMYXIN-HC 1 % OT SOLN
3.0000 [drp] | Freq: Four times a day (QID) | OTIC | Status: DC
  Administered 2019-12-19: 3 [drp] via OTIC
  Filled 2019-12-19: qty 10

## 2019-12-19 NOTE — ED Triage Notes (Signed)
R sided otaligia x 3 days. Reports that she has been trying to make it pop and the irritation has increased. No trauma. A&Ox4,

## 2019-12-19 NOTE — ED Provider Notes (Signed)
[facility]-EMERGENCY DEPT Provider Note   CSN: [id number] Arrival date & time: 12/19/19  6720     History Chief Complaint  Patient presents with  . Otalgia    Carrie Conner is a 29 y.o. female.  Patient to ED with complaint of right ear pain x 3 days, worse in the last several hours. No bleeding but she reports some "moisture'. She feels her hearing is muffled. She states that if she can "pop" her ear then if feels better but for the last several hours she has not been able to pop it. No nasal congestion, sore throat, fever, cough, sneezing or headache.   The history is provided by the patient. No language interpreter was used.  Otalgia Associated symptoms: no congestion, no cough, no fever, no rhinorrhea and no sore throat        History reviewed. No pertinent past medical history.  There are no problems to display for this patient.   Past Surgical History:  Procedure Laterality Date  . CESAREAN SECTION       OB History   No obstetric history on file.     History reviewed. No pertinent family history.  Social History   Tobacco Use  . Smoking status: Former Games developer  . Smokeless tobacco: Never Used  Vaping Use  . Vaping Use: Never used  Substance Use Topics  . Alcohol use: No  . Drug use: Yes    Types: Marijuana    Home Medications Prior to Admission medications   Medication Sig Start Date End Date Taking? Authorizing Provider  ibuprofen (ADVIL,MOTRIN) 800 MG tablet Take 1 tablet (800 mg total) by mouth every 8 (eight) hours as needed. 03/25/18   Charlestine Night, PA-C    Allergies    Patient has no known allergies.  Review of Systems   Review of Systems  Constitutional: Negative for fever.  HENT: Positive for ear pain. Negative for congestion, rhinorrhea, sinus pain, sneezing and sore throat.   Respiratory: Negative for cough.     Physical Exam Updated Vital Signs BP 128/90 (BP Location: Left Arm)   Pulse 82   Temp 97.9  F (36.6 C) (Oral)   Resp 15   Ht 5\' 4"  (1.626 m)   Wt 59 kg   SpO2 98%   BMI 22.31 kg/m   Physical Exam Vitals and nursing note reviewed.  Constitutional:      Appearance: She is well-developed.  HENT:     Head:     Comments: Right TM partially obscured by purulent material. Visible TM is normal in appearance. No significant swelling. No erythematous change to external canal. No pain with ear movement.     Left Ear: Tympanic membrane normal. There is impacted cerumen.  Pulmonary:     Effort: Pulmonary effort is normal.  Musculoskeletal:     Cervical back: Normal range of motion.  Skin:    General: Skin is warm and dry.  Neurological:     Mental Status: She is alert and oriented to person, place, and time.     ED Results / Procedures / Treatments   Labs (all labs ordered are listed, but only abnormal results are displayed) Labs Reviewed - No data to display  EKG None  Radiology No results found.  Procedures Procedures (including critical care time)  Medications Ordered in ED Medications - No data to display  ED Course  I have reviewed the triage vital signs and the nursing notes.  Pertinent labs & imaging results that  were available during my care of the patient were reviewed by me and considered in my medical decision making (see chart for details).    MDM Rules/Calculators/A&P                          Patient to ED with ear pain as described in the history of present illness.   She is well appearing. In NAD. VSS. Afebrile. There is some changes of infection to the external canal. TM appears ok. No URI or allergy symptoms.   Will provide cortisporin otic and encourage PCP recheck if no better in 2 days.  Final Clinical Impression(s) / ED Diagnoses Final diagnoses:  None   1. Otitis externa 2. Otalgia  Rx / DC Orders ED Discharge Orders    None       Elpidio Anis, Cordelia Poche 12/19/19 [id number]    Mesner, Barbara Cower, MD 12/19/19 [phone]

## 2019-12-19 NOTE — Discharge Instructions (Addendum)
Use the drops as directed. Follow up with your doctor for recheck if symptoms do not improve in 2 days with use of the ear drops.

## 2020-02-20 ENCOUNTER — Emergency Department (HOSPITAL_COMMUNITY)
Admission: EM | Admit: 2020-02-20 | Discharge: 2020-02-20 | Disposition: A | Discharge status: Home / Self Care | Payer: Medicaid Other | Attending: Emergency Medicine | Admitting: Emergency Medicine

## 2020-02-20 ENCOUNTER — Other Ambulatory Visit: Payer: Self-pay

## 2020-02-20 DIAGNOSIS — T23201A Burn of second degree of right hand, unspecified site, initial encounter: Secondary | ICD-10-CM | POA: Diagnosis not present

## 2020-02-20 DIAGNOSIS — X088XXA Exposure to other specified smoke, fire and flames, initial encounter: Secondary | ICD-10-CM | POA: Insufficient documentation

## 2020-02-20 DIAGNOSIS — T300 Burn of unspecified body region, unspecified degree: Secondary | ICD-10-CM

## 2020-02-20 DIAGNOSIS — Z87891 Personal history of nicotine dependence: Secondary | ICD-10-CM | POA: Diagnosis not present

## 2020-02-20 DIAGNOSIS — T23001A Burn of unspecified degree of right hand, unspecified site, initial encounter: Secondary | ICD-10-CM | POA: Diagnosis present

## 2020-02-20 MED ORDER — SILVER SULFADIAZINE 1 % EX CREA: 1.0000 "application " | TOPICAL_CREAM | Freq: Every day | CUTANEOUS | 0 refills | Status: DC

## 2020-02-20 MED ORDER — SILVER SULFADIAZINE 1 % EX CREA
TOPICAL_CREAM | Freq: Once | CUTANEOUS | Status: AC
  Filled 2020-02-20: qty 50

## 2020-02-20 NOTE — ED Provider Notes (Signed)
[facility]-EMERGENCY DEPT Provider Note   CSN: [id number] Arrival date & time: 02/20/20  1711     History Chief Complaint  Patient presents with  . Hand Burn    Carrie Conner is a 29 y.o. female.  HPI   Patient with no significant medical history presents to the emergency department with chief complaint of burn on her right hand.  Patient states she was making soap and some of the melted wax fell onto her hand.  She immediately went to clean it off and noticed that her skin was "melting off".  She denies hitting her head, losing consciousness, is not on anticoags.  She wrapped her hand up and came to emergency department to be evaluated.  She is not immunocompromise, has not taken any pain medications, states her last tetanus shot was last year.  Patient denies headache, fever, chills, shortness of breath, chest pain, dumping, nausea, vomiting, diarrhea, pedal edema.  No past medical history on file.  There are no problems to display for this patient.   Past Surgical History:  Procedure Laterality Date  . CESAREAN SECTION       OB History   No obstetric history on file.     No family history on file.  Social History   Tobacco Use  . Smoking status: Former Games developer  . Smokeless tobacco: Never Used  Vaping Use  . Vaping Use: Never used  Substance Use Topics  . Alcohol use: No  . Drug use: Yes    Types: Marijuana    Home Medications Prior to Admission medications   Medication Sig Start Date End Date Taking? Authorizing Provider  ibuprofen (ADVIL,MOTRIN) 800 MG tablet Take 1 tablet (800 mg total) by mouth every 8 (eight) hours as needed. 03/25/18   Lawyer, Cristal Deer, PA-C  silver sulfADIAZINE (SILVADENE) 1 % cream Apply 1 application topically daily. Apply to hand 1 times daily for 7 days. 02/20/20   Carroll Sage, PA-C    Allergies    Patient has no known allergies.  Review of Systems   Review of Systems  Constitutional: Negative  for chills and fever.  HENT: Negative for congestion, tinnitus, trouble swallowing and voice change.   Eyes: Negative for visual disturbance.  Respiratory: Negative for shortness of breath.   Cardiovascular: Negative for chest pain.  Gastrointestinal: Negative for abdominal pain, diarrhea, nausea and vomiting.  Genitourinary: Negative for enuresis.  Musculoskeletal: Negative for back pain.  Skin: Negative for rash.       Burn to her right hand.  Neurological: Negative for dizziness and headaches.  Hematological: Does not bruise/bleed easily.    Physical Exam Updated Vital Signs BP 113/86 (BP Location: Left Arm)   Pulse 84   Temp 99.1 F (37.3 C) (Oral)   Resp 17   Ht 5\' 4"  (1.626 m)   Wt 61.2 kg   SpO2 100%   BMI 23.17 kg/m   Physical Exam Vitals and nursing note reviewed.  Constitutional:      General: She is not in acute distress.    Appearance: Normal appearance. She is not ill-appearing or diaphoretic.  HENT:     Head: Normocephalic and atraumatic.     Nose: No congestion or rhinorrhea.  Eyes:     General: No scleral icterus.       Right eye: No discharge.        Left eye: No discharge.     Conjunctiva/sclera: Conjunctivae normal.  Pulmonary:     Effort: Pulmonary effort  is normal.     Breath sounds: Normal breath sounds.  Musculoskeletal:     Cervical back: Neck supple.     Right lower leg: No edema.     Left lower leg: No edema.  Skin:    General: Skin is warm and dry.     Coloration: Skin is not jaundiced or pale.     Findings: Lesion and rash present.     Comments: Patient's right hand was visualized she had first and second-degree burns noted on the posterior aspect of her right hand measuring a total area of 2 cm in diameter.  Patient had multiple blisters which had burst, no erythema, edema or drainage noted.  She had full range of motion, 5 5 strength neurovascular fully intact in her hand.  Neurological:     Mental Status: She is alert and oriented to  person, place, and time.  Psychiatric:        Mood and Affect: Mood normal.     ED Results / Procedures / Treatments   Labs (all labs ordered are listed, but only abnormal results are displayed) Labs Reviewed - No data to display  EKG None  Radiology No results found.  Procedures Procedures (including critical care time)  Medications Ordered in ED Medications  silver sulfADIAZINE (SILVADENE) 1 % cream ( Topical Given 02/20/20 1913)    ED Course  I have reviewed the triage vital signs and the nursing notes.  Pertinent labs & imaging results that were available during my care of the patient were reviewed by me and considered in my medical decision making (see chart for details).    MDM Rules/Calculators/A&P                          Patient presents with burn to her right hand.  She was alert, did not appear in acute distress, vital signs reassuring.  Due to well-appearing patient, benign physical exam, reassuring vital signs further lab and imaging not warranted at this time.  I have low suspicion for overlying cellulitis or deep tissue infection as it would be unlikely for infection develop in less than 4 hours, there is no erythema, no induration or fluctuance felt on exam.  Low suspicion for third or fourth degree burns as there is no charring of the skin, patient had full sensation on the back of her hand.  Low suspicion for ligament or tendon damage as she had full range of motion as well as 5/5 strength at her fingers and wrist.  Low suspicion for compartment syndrome as area was soft to the touch, burn was not circumferential, neurovascular fully intact.  I suspect patient has a mixture of 1 and 2 degree burns.  Will provide Silvadene provide basic wound care and follow-up with PCP.  Vital signs have remained stable, no indication for hospital admission. Patient given at home care as well strict return precautions.  Patient verbalized that they understood agreed to said  plan.  Final Clinical Impression(s) / ED Diagnoses Final diagnoses:  Burn    Rx / DC Orders ED Discharge Orders         Ordered    silver sulfADIAZINE (SILVADENE) 1 % cream  Daily        02/20/20 1902           Carroll Sage, PA-C 02/20/20 2019    Charlynne Pander, MD 02/20/20 [phone]

## 2020-02-20 NOTE — ED Triage Notes (Addendum)
Patient presents with burns on the right hand. Patient states she was burned right before she came in while making soap. She reports the skin "melting off" her hand.

## 2020-02-20 NOTE — Discharge Instructions (Signed)
Seen here for burn.  Exam looks reassuring.  I recommend keeping the area clean, rinse off with cool clean water, applying Silvadene, and changing dressings once daily.  You may take over-the-counter pain medications like ibuprofen or Tylenol every 6 as needed please follow dosing the back of bottle.  Applying cool cloths to the area can also help with pain as well.  Recommend following up with your PCP in 2 weeks time if symptoms not fully resolved.  Come back to the emergency department if you develop chest pain, shortness of breath, severe abdominal pain, uncontrolled nausea, vomiting, diarrhea.

## 2020-10-09 ENCOUNTER — Encounter: Payer: Self-pay | Admitting: Neurology

## 2020-10-09 ENCOUNTER — Ambulatory Visit: Payer: Medicaid Other | Admitting: Neurology

## 2020-10-09 VITALS — BP 106/72 | HR 65 | Ht 64.0 in | Wt 135.0 lb

## 2020-10-09 DIAGNOSIS — G43019 Migraine without aura, intractable, without status migrainosus: Secondary | ICD-10-CM

## 2020-10-09 MED ORDER — AMITRIPTYLINE HCL 25 MG PO TABS: 50.0000 mg | ORAL_TABLET | Freq: Every day | ORAL | 3 refills | Status: AC

## 2020-10-09 NOTE — Progress Notes (Signed)
Subjective:    Patient ID: Carrie Conner is a 30 y.o. female.  HPI    [name], MD, PhD [facility] [address], [address] [address] [place], [place] [address]  Dear Dr. Conley Rolls,   I saw your patient, Carrie Conner, upon your kind request in my neurologic clinic today for initial consultation of her headaches.  The patient is unaccompanied today.  As you know, Carrie Conner is a 30 year old right-handed woman with an underlying medical history of anxiety and depression, currently not on any medication, who reports a history of recurrent headaches for the past 1 year.  Headaches are almost daily at this time.  They are associated with nausea occasionally, typically photophobia and are often left-sided, throbbing.  She has a family history of migraine headaches affecting her mom and maternal grandmother.  She has not seen her primary care physician in some time.  She is also not on her antidepressant medication any longer.  She was prescribed Lamictal in the past and Seroquel to help her sleep.  She stopped her medications on her own some months ago.  She reports that she weaned herself off.  She has new eyeglasses, she does complain of pinching of her eyeglasses behind the ears bilaterally and she is quite sensitive.  Sometimes when she has a tight ponytail it also hurts her head.  She has not noticed any sudden onset of one-sided weakness or numbness or tingling or droopy face or slurring of speech, she has not been on any prescription medicine for migraines.  She has been using over-the-counter anti-inflammatory medications as needed.  She does not smoke any cigarettes but smokes marijuana occasionally.  She tries to hydrate well with water and estimates that she drinks about 3 bottles of water per day.  She does not drink caffeine on a daily basis and drinks alcohol occasionally.  She is single, she lives with her 2 children, ages 50 and 13.  She works in a club.    Her Past  Medical History Is Significant For: No past medical history on file.  Her Past Surgical History Is Significant For: Past Surgical History:  Procedure Laterality Date   CESAREAN SECTION      Her Family History Is Significant For: No family history on file.  Her Social History Is Significant For: Social History   Socioeconomic History   Marital status: Single    Spouse name: Not on file   Number of children: Not on file   Years of education: Not on file   Highest education level: Not on file  Occupational History   Not on file  Tobacco Use   Smoking status: Former    Pack years: 0.00   Smokeless tobacco: Never  Vaping Use   Vaping Use: Never used  Substance and Sexual Activity   Alcohol use: No   Drug use: Yes    Types: Marijuana   Sexual activity: Yes    Birth control/protection: None  Other Topics Concern   Not on file  Social History Narrative   Not on file   Social Determinants of Health   Financial Resource Strain: Not on file  Food Insecurity: Not on file  Transportation Needs: Not on file  Physical Activity: Not on file  Stress: Not on file  Social Connections: Not on file    Her Allergies Are:  Allergies  Allergen Reactions   Codeine Hives  :   Her Current Medications Are:  Outpatient Encounter Medications as of  10/09/2020  Medication Sig   ibuprofen (ADVIL) 200 MG tablet Take 200 mg by mouth every 6 (six) hours as needed.   [DISCONTINUED] ibuprofen (ADVIL,MOTRIN) 800 MG tablet Take 1 tablet (800 mg total) by mouth every 8 (eight) hours as needed. (Patient not taking: Reported on 10/09/2020)   [DISCONTINUED] silver sulfADIAZINE (SILVADENE) 1 % cream Apply 1 application topically daily. Apply to hand 1 times daily for 7 days. (Patient not taking: Reported on 10/09/2020)   No facility-administered encounter medications on file as of 10/09/2020.  :   Review of Systems:  Out of a complete 14 point review of systems, all are reviewed and negative with  the exception of these symptoms as listed below: Review of Systems  Neurological:        Here for consult on worsening h/a. Reports sx have been worsening over the last year. Reports h/a is present everyday severity fluctuates. Reports she takes some otc ( namely ibuprofen) but does not help. Reports a fall as a young child that resulted in a 3 week hospital stay.     Objective:  Neurological Exam  Physical Exam Physical Examination:   Vitals:   10/09/20 0800  BP: 106/72  Pulse: 65    General Examination: The patient is a very pleasant 30 y.o. female in no acute distress. She appears well-developed and well-nourished and well groomed.   HEENT: Normocephalic, atraumatic, pupils are equal, round and reactive to light and accommodation. Funduscopic exam is normal with sharp disc margins noted. Extraocular tracking is good without limitation to gaze excursion or nystagmus noted. Normal smooth pursuit is noted. Hearing is grossly intact. Face is symmetric with normal facial animation and normal facial sensation. Speech is clear with no dysarthria noted. There is no hypophonia. There is no lip, neck/head, jaw or voice tremor. Neck is supple with full range of passive and active motion. There are no carotid bruits on auscultation. Oropharynx exam reveals: mild mouth dryness, adequate dental hygiene. Tongue protrudes centrally and palate elevates symmetrically.   Chest: Clear to auscultation without wheezing, rhonchi or crackles noted.  Heart: S1+S2+0, regular and normal without murmurs, rubs or gallops noted.   Abdomen: Soft, non-tender and non-distended with normal bowel sounds appreciated on auscultation.  Extremities: There is no pitting edema in the distal lower extremities bilaterally. Pedal pulses are intact.  Skin: Warm and dry without trophic changes noted. There are no varicose veins.  Musculoskeletal: exam reveals no obvious joint deformities, tenderness or joint swelling or  erythema.   Neurologically:  Mental status: The patient is awake, alert and oriented in all 4 spheres. Her immediate and remote memory, attention, language skills and fund of knowledge are appropriate. There is no evidence of aphasia, agnosia, apraxia or anomia. Speech is clear with normal prosody and enunciation. Thought process is linear. Mood is normal and affect is normal.  Cranial nerves II - XII are as described above under HEENT exam. In addition: shoulder shrug is normal with equal shoulder height noted. Motor exam: Normal bulk, strength and tone is noted. There is no drift, tremor or rebound. Romberg is negative. Reflexes are 2+ throughout. Babinski: Toes are flexor bilaterally. Fine motor skills and coordination: intact with normal finger taps, normal hand movements, normal rapid alternating patting, normal foot taps and normal foot agility.  Cerebellar testing: No dysmetria or intention tremor on finger to nose testing. Heel to shin is unremarkable bilaterally. There is no truncal or gait ataxia.  Sensory exam: intact to light touch, vibration, temperature  sense in the upper and lower extremities.  Gait, station and balance: She stands easily. No veering to one side is noted. No leaning to one side is noted. Posture is age-appropriate and stance is narrow based. Gait shows normal stride length and normal pace. No problems turning are noted. Tandem walk is unremarkable.   Assessment and Plan:  In summary, Carrie Conner is a very pleasant 30 y.o.-year old female with an underlying medical history of anxiety and depression, currently not on any medications who presents for evaluation of her recurrent headaches of approximately 1 years duration.  History and description of her headaches are supportive of migraine headaches.  She has a nonfocal examination and is largely reassured today.  We mutually agreed to pursue medication for preventative purposes.  I would like to avoid Topamax as she is on  the lower side of normal weight.  She is furthermore advised that I would like to avoid a beta-blocker or calcium channel blocker to not cause any low blood pressure values as her blood pressure is on the lower end of the spectrum.  She also has a heart rate in the mid 60s currently.  A beta-blocker could exacerbate depression and also lower her heart rate into the bradycardia range.  I would like for her to try amitriptyline and low-dose with gradual titration.  In fact, it may help her sleep a little better as she took Seroquel in the past to help her sleep at night.  She is advised to follow-up with her psychiatrist as she has not told them about the medication stop and she is advised never to stop any antidepressant medication or anxiety medication abruptly and without notifying her prescriber.  She is agreeable.  She is furthermore advised to follow-up with her primary care on a regular basis or establish care with a new primary care she indicates that she would like to switch.  She is advised to proceed with a brain MRI with and without contrast to rule out a structural cause of her recurrent headaches.  She had a recent benign eye exam through your office.  She is advised to start with amitriptyline 25 mg strength half a pill at bedtime and gradually increase to up to 50 mg at bedtime in the next few weeks.  She is advised to follow-up routinely to see one of our nurse practitioners in this clinic in 3 months, sooner if needed.  She was given a new prescription and detailed instructions in writing today.  I answered all her questions today and she was in agreement.    Thank you very much for allowing me to participate in the care of this nice patient. If I can be of any further assistance to you please do not hesitate to call me at [phone].  Sincerely,   [name], MD, PhD

## 2020-10-09 NOTE — Patient Instructions (Signed)
Elavil (generic name: amitriptyline) 25 mg: Take half a pill daily at bedtime for one week, then one pill daily at bedtime for one week, then one and a half pills daily at bedtime for one week, then 2 pills daily at bedtime thereafter. Common side effects reported are: mouth dryness, drowsiness, confusion, dizziness.

## 2020-10-10 ENCOUNTER — Telehealth: Payer: Self-pay

## 2020-10-10 LAB — COMPREHENSIVE METABOLIC PANEL
ALT: 10 IU/L (ref 0–32)
AST: 14 IU/L (ref 0–40)
Albumin/Globulin Ratio: 2 (ref 1.2–2.2)
Albumin: 4.6 g/dL (ref 3.9–5.0)
Alkaline Phosphatase: 53 IU/L (ref 44–121)
BUN/Creatinine Ratio: 11 (ref 9–23)
BUN: 8 mg/dL (ref 6–20)
Bilirubin Total: 0.3 mg/dL (ref 0.0–1.2)
CO2: 23 mmol/L (ref 20–29)
Calcium: 9.6 mg/dL (ref 8.7–10.2)
Chloride: 104 mmol/L (ref 96–106)
Creatinine, Ser: 0.73 mg/dL (ref 0.57–1.00)
Globulin, Total: 2.3 g/dL (ref 1.5–4.5)
Glucose: 95 mg/dL (ref 65–99)
Potassium: 4 mmol/L (ref 3.5–5.2)
Sodium: 140 mmol/L (ref 134–144)
Total Protein: 6.9 g/dL (ref 6.0–8.5)
eGFR: 116 mL/min/{1.73_m2} (ref >59)

## 2020-10-10 NOTE — Telephone Encounter (Signed)
-----   Message from [name], MD sent at 10/10/2020  7:28 AM EDT ----- Kidney function and electrolytes as well as liver function are fine.  Okay to proceed with brain MRI as discussed.  Please notify patient.

## 2020-10-10 NOTE — Telephone Encounter (Signed)
I called the pt and advised of results. She verbalized understanding and had no questions/concerns.  

## 2020-10-19 ENCOUNTER — Other Ambulatory Visit: Payer: Self-pay

## 2020-10-19 ENCOUNTER — Ambulatory Visit
Admission: RE | Admit: 2020-10-19 | Discharge: 2020-10-19 | Disposition: A | Discharge status: Home / Self Care | Payer: Medicaid Other | Source: Ambulatory Visit | Attending: Neurology | Admitting: Neurology

## 2020-10-19 DIAGNOSIS — G43019 Migraine without aura, intractable, without status migrainosus: Secondary | ICD-10-CM | POA: Diagnosis not present

## 2020-10-19 MED ORDER — GADOBENATE DIMEGLUMINE 529 MG/ML IV SOLN
12.0000 mL | Freq: Once | INTRAVENOUS | Status: AC | PRN
  Administered 2020-10-19: 12 mL via INTRAVENOUS

## 2020-10-24 NOTE — Progress Notes (Signed)
Called and lvm for pt to return phone call.

## 2020-10-25 ENCOUNTER — Telehealth: Payer: Self-pay | Admitting: *Deleted

## 2020-10-25 NOTE — Telephone Encounter (Signed)
-----   Message from [name], MD sent at 10/23/2020  5:34 PM EDT ----- Please call patient and advise her that her recent brain MRI with and without contrast from 10/19/2020 was reported as normal.  She can follow-up as planned/scheduled.

## 2021-01-14 NOTE — Progress Notes (Deleted)
No chief complaint on file.    HISTORY OF PRESENT ILLNESS:  01/14/21 ALL:  Carrie Conner is a 30 y.o. female here today for follow up for migraines. She was started on amitriptyline 25mg  with plans to increase to 50mg  at consult with Carrie 09/2020. Since,   psychiatrist  HISTORY (copied from Carrie Carrie Conner previous note)  Dear Carrie. 10/2020,    I saw your patient, Carrie Conner, upon your kind request in my neurologic clinic today for initial consultation of her headaches.  The patient is unaccompanied today.  As you know, Carrie Conner is a 30 year old right-handed woman with an underlying medical history of anxiety and depression, currently not on any medication, who reports a history of recurrent headaches for the past 1 year.  Headaches are almost daily at this time.  They are associated with nausea occasionally, typically photophobia and are often left-sided, throbbing.  She has a family history of migraine headaches affecting her mom and maternal grandmother.  She has not seen her primary care physician in some time.  She is also not on her antidepressant medication any longer.  She was prescribed Lamictal in the past and Seroquel to help her sleep.  She stopped her medications on her own some months ago.  She reports that she weaned herself off.  She has new eyeglasses, she does complain of pinching of her eyeglasses behind the ears bilaterally and she is quite sensitive.  Sometimes when she has a tight ponytail it also hurts her head.  She has not noticed any sudden onset of one-sided weakness or numbness or tingling or droopy face or slurring of speech, she has not been on any prescription medicine for migraines.  She has been using over-the-counter anti-inflammatory medications as needed.  She does not smoke any cigarettes but smokes marijuana occasionally.  She tries to hydrate well with water and estimates that she drinks about 3 bottles of water per day.  She does not drink caffeine on a  daily basis and drinks alcohol occasionally.  She is single, she lives with her 2 children, ages 49 and 76.  She works in a club.    REVIEW OF SYSTEMS: Out of a complete 14 system review of symptoms, the patient complains only of the following symptoms, and all other reviewed systems are negative.   ALLERGIES: Allergies  Allergen Reactions   Codeine Hives     HOME MEDICATIONS: Outpatient Medications Prior to Visit  Medication Sig Dispense Refill   amitriptyline (ELAVIL) 25 MG tablet Take 2 tablets (50 mg total) by mouth at bedtime. Follow titration instructions provided in writing. 60 tablet 3   ibuprofen (ADVIL) 200 MG tablet Take 200 mg by mouth every 6 (six) hours as needed.     No facility-administered medications prior to visit.     PAST MEDICAL HISTORY: No past medical history on file.   PAST SURGICAL HISTORY: Past Surgical History:  Procedure Laterality Date   CESAREAN SECTION       FAMILY HISTORY: No family history on file.   SOCIAL HISTORY: Social History   Socioeconomic History   Marital status: Single    Spouse name: Not on file   Number of children: Not on file   Years of education: Not on file   Highest education level: Not on file  Occupational History   Not on file  Tobacco Use   Smoking status: Former   Smokeless tobacco: Never  Vaping Use   Vaping Use: Never used  Substance and Sexual Activity   Alcohol use: No   Drug use: Yes    Types: Marijuana   Sexual activity: Yes    Birth control/protection: None  Other Topics Concern   Not on file  Social History Narrative   Not on file   Social Determinants of Health   Financial Resource Strain: Not on file  Food Insecurity: Not on file  Transportation Needs: Not on file  Physical Activity: Not on file  Stress: Not on file  Social Connections: Not on file  Intimate Partner Violence: Not on file     PHYSICAL EXAM  There were no vitals filed for this visit. There is no height or  weight on file to calculate BMI.  Generalized: Well developed, in no acute distress  Cardiology: normal rate and rhythm, no murmur auscultated  Respiratory: clear to auscultation bilaterally    Neurological examination  Mentation: Alert oriented to time, place, history taking. Follows all commands speech and language fluent Cranial nerve II-XII: Pupils were equal round reactive to light. Extraocular movements were full, visual field were full on confrontational test. Facial sensation and strength were normal. Uvula tongue midline. Head turning and shoulder shrug  were normal and symmetric. Motor: The motor testing reveals 5 over 5 strength of all 4 extremities. Good symmetric motor tone is noted throughout.  Sensory: Sensory testing is intact to soft touch on all 4 extremities. No evidence of extinction is noted.  Coordination: Cerebellar testing reveals good finger-nose-finger and heel-to-shin bilaterally.  Gait and station: Gait is normal. Tandem gait is normal. Romberg is negative. No drift is seen.  Reflexes: Deep tendon reflexes are symmetric and normal bilaterally.    DIAGNOSTIC DATA (LABS, IMAGING, TESTING) - I reviewed patient records, labs, notes, testing and imaging myself where available.  Lab Results  Component Value Date   WBC 6.8 03/25/2018   HGB 12.0 03/25/2018   HCT 37.7 03/25/2018   MCV 91.7 03/25/2018   PLT 263 03/25/2018      Component Value Date/Time   NA 140 10/09/2020 0925   K 4.0 10/09/2020 0925   CL 104 10/09/2020 0925   CO2 23 10/09/2020 0925   GLUCOSE 95 10/09/2020 0925   GLUCOSE 80 03/25/2018 1547   BUN 8 10/09/2020 0925   CREATININE 0.73 10/09/2020 0925   CALCIUM 9.6 10/09/2020 0925   PROT 6.9 10/09/2020 0925   ALBUMIN 4.6 10/09/2020 0925   AST 14 10/09/2020 0925   ALT 10 10/09/2020 0925   ALKPHOS 53 10/09/2020 0925   BILITOT 0.3 10/09/2020 0925   GFRNONAA >60 03/25/2018 1547   GFRAA >60 03/25/2018 1547   No results found for: CHOL, HDL,  LDLCALC, LDLDIRECT, TRIG, CHOLHDL No results found for: IDPO2U No results found for: VITAMINB12 No results found for: TSH  No flowsheet data found.   No flowsheet data found.   ASSESSMENT AND PLAN  30 y.o. year old female  has no past medical history on file. here with    No diagnosis found.   No orders of the defined types were placed in this encounter.    No orders of the defined types were placed in this encounter.     Shawnie Dapper, MSN, FNP-C 01/14/2021, 2:35 PM  [facility] [address] [place], [place] [address] [phone]

## 2021-01-15 ENCOUNTER — Encounter: Payer: Self-pay | Admitting: Family Medicine

## 2021-01-15 ENCOUNTER — Ambulatory Visit: Payer: Medicaid Other | Admitting: Family Medicine

## 2021-01-15 DIAGNOSIS — G43019 Migraine without aura, intractable, without status migrainosus: Secondary | ICD-10-CM

## 2021-12-08 ENCOUNTER — Ambulatory Visit: Admit: 2021-12-08 | Discharge status: Absent without leave | Payer: Medicaid Other

## 2021-12-09 ENCOUNTER — Ambulatory Visit
Admission: EM | Admit: 2021-12-09 | Discharge: 2021-12-09 | Disposition: A | Discharge status: Home / Self Care | Payer: Medicaid Other | Attending: Emergency Medicine | Admitting: Emergency Medicine

## 2021-12-09 DIAGNOSIS — N898 Other specified noninflammatory disorders of vagina: Secondary | ICD-10-CM

## 2021-12-09 DIAGNOSIS — R1030 Lower abdominal pain, unspecified: Secondary | ICD-10-CM | POA: Diagnosis present

## 2021-12-09 LAB — POCT URINALYSIS DIP (MANUAL ENTRY)
Blood, UA: NEGATIVE
Glucose, UA: NEGATIVE mg/dL
Leukocytes, UA: NEGATIVE
Nitrite, UA: NEGATIVE
Protein Ur, POC: 30 mg/dL — AB
Spec Grav, UA: 1.02 (ref 1.010–1.025)
Urobilinogen, UA: 2 E.U./dL — AB
pH, UA: 7.5 (ref 5.0–8.0)

## 2021-12-09 LAB — POCT URINE PREGNANCY: Preg Test, Ur: NEGATIVE

## 2021-12-09 MED ORDER — METRONIDAZOLE 0.75 % VA GEL: 1.0000 | Freq: Every day | VAGINAL | 0 refills | Status: AC

## 2021-12-09 NOTE — Discharge Instructions (Addendum)
Use the [facility] as directed.    Your vaginal tests are pending.  If your test results are positive, we will call you.  You and your sexual partner(s) may require treatment at that time.  Do not have sexual activity for at least 7 days.    Go to the emergency department if you have worsening symptoms.    Follow up with your primary care provider.

## 2021-12-09 NOTE — ED Provider Notes (Addendum)
Carrie Conner    CSN: [id number] Arrival date & time: 12/09/21  1044      History   Chief Complaint Chief Complaint  Patient presents with   Vaginal Discharge    Have been having this problem for almost two weeks and a smell has now come. I took fluconazole a week ago thinking it was a yeast infection and the symptoms didn't go away. I have recently had a new partner - Entered by patient    HPI Carrie Conner is a 31 y.o. female.  Patient presents with malodorous vaginal discharge x2 weeks.  She states this is similar to previous episodes of bacterial vaginitis.  She attempted treatment one week ago with a Diflucan that she had leftover from previous symptoms.  She recently has a new sexual partner and requests STD testing.  Patient also reports lower abdominal pain x2 weeks.  She denies pelvic pain, fever, chills, vomiting, diarrhea, constipation, or other symptoms.  She denies current pregnancy or breastfeeding.     The history is provided by the patient and medical records.    History reviewed. No pertinent past medical history.  There are no problems to display for this patient.   Past Surgical History:  Procedure Laterality Date   CESAREAN SECTION      OB History   No obstetric history on file.      Home Medications    Prior to Admission medications   Medication Sig Start Date End Date Taking? Authorizing Provider  metroNIDAZOLE (METROGEL VAGINAL) 0.75 % vaginal gel Place 1 Applicatorful vaginally at bedtime for 5 days. 12/09/21 12/14/21 Yes Mickie Bail, NP  amitriptyline (ELAVIL) 25 MG tablet Take 2 tablets (50 mg total) by mouth at bedtime. Follow titration instructions provided in writing. 10/09/20   [name], MD  ibuprofen (ADVIL) 200 MG tablet Take 200 mg by mouth every 6 (six) hours as needed.    [provider]    Family History History reviewed. No pertinent family history.  Social History Social History   Tobacco Use   Smoking  status: Former   Smokeless tobacco: Never  Building services engineer Use: Never used  Substance Use Topics   Alcohol use: No   Drug use: Yes    Types: Marijuana     Allergies   Codeine   Review of Systems Review of Systems  Constitutional:  Negative for chills and fever.  Gastrointestinal:  Positive for abdominal pain. Negative for constipation, diarrhea, nausea and vomiting.  Genitourinary:  Positive for vaginal discharge. Negative for dysuria, flank pain, hematuria and pelvic pain.  Skin:  Negative for color change and rash.  All other systems reviewed and are negative.    Physical Exam Triage Vital Signs ED Triage Vitals  Enc Vitals Group     BP 12/09/21 1057 115/70     Pulse Rate 12/09/21 1057 76     Resp 12/09/21 1057 17     Temp 12/09/21 1057 98.3 F (36.8 C)     Temp src --      SpO2 12/09/21 1057 99 %     Weight 12/09/21 1100 135 lb (61.2 kg)     Height 12/09/21 1100 5\' 4"  (1.626 m)     Head Circumference --      Peak Flow --      Pain Score 12/09/21 1059 0     Pain Loc --      Pain Edu? --      Excl. in  [place]? --    No data found.  Updated Vital Signs BP 115/70   Pulse 76   Temp 98.3 F (36.8 C)   Resp 17   Ht 5\' 4"  (1.626 m)   Wt 135 lb (61.2 kg)   LMP 11/15/2021   SpO2 99%   BMI 23.17 kg/m   Visual Acuity Right Eye Distance:   Left Eye Distance:   Bilateral Distance:    Right Eye Near:   Left Eye Near:    Bilateral Near:     Physical Exam Vitals and nursing note reviewed.  Constitutional:      General: She is not in acute distress.    Appearance: Normal appearance. She is well-developed. She is not ill-appearing.  HENT:     Mouth/Throat:     Mouth: Mucous membranes are moist.  Cardiovascular:     Rate and Rhythm: Normal rate and regular rhythm.     Heart sounds: Normal heart sounds.  Pulmonary:     Effort: Pulmonary effort is normal. No respiratory distress.     Breath sounds: Normal breath sounds.  Abdominal:     General: Bowel  sounds are normal.     Palpations: Abdomen is soft.     Tenderness: There is no abdominal tenderness. There is no right CVA tenderness, left CVA tenderness, guarding or rebound.  Musculoskeletal:     Cervical back: Neck supple.  Skin:    General: Skin is warm and dry.  Neurological:     Mental Status: She is alert.  Psychiatric:        Mood and Affect: Mood normal.        Behavior: Behavior normal.      UC Treatments / Results  Labs (all labs ordered are listed, but only abnormal results are displayed) Labs Reviewed  POCT URINALYSIS DIP (MANUAL ENTRY) - Abnormal; Notable for the following components:      Result Value   Bilirubin, UA small (*)    Ketones, POC UA small (15) (*)    Protein Ur, POC =30 (*)    Urobilinogen, UA 2.0 (*)    All other components within normal limits  POCT URINE PREGNANCY    EKG   Radiology No results found.  Procedures Procedures (including critical care time)  Medications Ordered in UC Medications - No data to display  Initial Impression / Assessment and Plan / UC Course  I have reviewed the triage vital signs and the nursing notes.  Pertinent labs & imaging results that were available during my care of the patient were reviewed by me and considered in my medical decision making (see chart for details).   Vaginal discharge, lower abdominal pain.  Afebrile, vital signs are stable.  Well-appearing and exam is reassuring.  Patient obtained vaginal self swab for testing.  She declines blood work.  Treating with metronidazole.  Discussed that we will call if test results are positive.  Discussed that she may require additional treatment at that time.  Discussed that sexual partner(s) may also require treatment.  Instructed patient to abstain from sexual activity for at least 7 days.  Instructed her to follow-up with her PCP or gynecologist if her symptoms are not improving.  ED precautions for abdominal pain discussed.  Patient agrees to plan of  care.    Final Clinical Impressions(s) / UC Diagnoses   Final diagnoses:  Vaginal discharge  Lower abdominal pain     Discharge Instructions      Use the MetroGel as directed.  Your vaginal tests are pending.  If your test results are positive, we will call you.  You and your sexual partner(s) may require treatment at that time.  Do not have sexual activity for at least 7 days.    Go to the emergency department if you have worsening symptoms.    Follow up with your primary care provider.         ED Prescriptions     Medication Sig Dispense Auth. Provider   metroNIDAZOLE (METROGEL VAGINAL) 0.75 % vaginal gel Place 1 Applicatorful vaginally at bedtime for 5 days. 50 g Mickie Bail, NP      PDMP not reviewed this encounter.   Mickie Bail, NP 12/09/21 1152    Mickie Bail, NP 12/09/21 1201

## 2021-12-09 NOTE — ED Triage Notes (Signed)
Patient to Urgent Care with complaints of vaginal discharge x2 weeks, reports discharge now has a foul odor. Patient originally thought that she had a yeast infection but symptoms were not relieved after taking fluconazole. Reports new sexual partner.   Denies any urinary symptoms. Reports some lower abdominal cramping.

## 2021-12-10 LAB — CERVICOVAGINAL ANCILLARY ONLY
Bacterial Vaginitis (gardnerella): POSITIVE — AB
Candida Glabrata: NEGATIVE
Candida Vaginitis: NEGATIVE
Chlamydia: NEGATIVE
Comment: NEGATIVE
Comment: NEGATIVE
Comment: NEGATIVE
Comment: NEGATIVE
Comment: NEGATIVE
Comment: NORMAL
Neisseria Gonorrhea: NEGATIVE
Trichomonas: NEGATIVE

## 2022-02-13 ENCOUNTER — Ambulatory Visit
Admission: EM | Admit: 2022-02-13 | Discharge: 2022-02-13 | Disposition: A | Discharge status: Home / Self Care | Payer: Medicaid Other

## 2022-02-13 DIAGNOSIS — M67442 Ganglion, left hand: Secondary | ICD-10-CM

## 2022-02-13 NOTE — Discharge Instructions (Addendum)
Follow-up with a hand specialist.

## 2022-02-13 NOTE — ED Provider Notes (Signed)
Roderic Palau    CSN: [id number] Arrival date & time: 02/13/22  1145      History   Chief Complaint Chief Complaint  Patient presents with   Abscess    HPI Carrie Conner is a 31 y.o. female. Patient presents with "bump" on her left index finger x 3 weeks.  It is getting bigger; occasionally tender if pressure applied.  No trauma.  No redness, bruising, fever, wound drainage, numbness, weakness, or other symptoms.  Patient attempted to drain the area without success.  No pertinent medical history.   The history is provided by the patient and medical records.    History reviewed. No pertinent past medical history.  There are no problems to display for this patient.   Past Surgical History:  Procedure Laterality Date   CESAREAN SECTION      OB History   No obstetric history on file.      Home Medications    Prior to Admission medications   Medication Sig Start Date End Date Taking? Authorizing Provider  amitriptyline (ELAVIL) 25 MG tablet Take 2 tablets (50 mg total) by mouth at bedtime. Follow titration instructions provided in writing. 10/09/20   Star Age, MD  ibuprofen (ADVIL) 200 MG tablet Take 200 mg by mouth every 6 (six) hours as needed.    [provider]    Family History History reviewed. No pertinent family history.  Social History Social History   Tobacco Use   Smoking status: Former   Smokeless tobacco: Never  Scientific laboratory technician Use: Never used  Substance Use Topics   Alcohol use: No   Drug use: Yes    Types: Marijuana     Allergies   Codeine   Review of Systems Review of Systems  Constitutional:  Negative for chills and fever.  Musculoskeletal:  Negative for arthralgias and joint swelling.  Skin:  Negative for color change and wound.       "Bump" on left index finger.   Neurological:  Negative for weakness and numbness.  All other systems reviewed and are negative.    Physical Exam Triage Vital Signs ED  Triage Vitals  Enc Vitals Group     BP      Pulse      Resp      Temp      Temp src      SpO2      Weight      Height      Head Circumference      Peak Flow      Pain Score      Pain Loc      Pain Edu?      Excl. in [place]?    No data found.  Updated Vital Signs BP 108/71 (BP Location: Left Arm)   Pulse 73   Temp 97.9 F (36.6 C)   Resp 18   Ht 5\' 4"  (1.626 m)   Wt 130 lb (59 kg)   LMP 01/04/2022   SpO2 97%   BMI 22.31 kg/m   Visual Acuity Right Eye Distance:   Left Eye Distance:   Bilateral Distance:    Right Eye Near:   Left Eye Near:    Bilateral Near:     Physical Exam Vitals and nursing note reviewed.  Constitutional:      General: She is not in acute distress.    Appearance: Normal appearance. She is well-developed. She is not ill-appearing.  HENT:  Mouth/Throat:     Mouth: Mucous membranes are moist.  Cardiovascular:     Rate and Rhythm: Normal rate and regular rhythm.  Pulmonary:     Effort: Pulmonary effort is normal. No respiratory distress.  Musculoskeletal:        General: No deformity. Normal range of motion.     Cervical back: Neck supple.  Skin:    General: Skin is warm and dry.     Capillary Refill: Capillary refill takes less than 2 seconds.     Findings: No bruising, erythema, lesion or rash.     Comments: 1 cm flesh-colored, nontender, soft, mobile cyst on left index finger between DIP and PIP.  No erythema, drainage, wound, ecchymosis.  Needle aspiration with no purulent drainage.   Neurological:     Mental Status: She is alert.     Sensory: No sensory deficit.     Motor: No weakness.  Psychiatric:        Mood and Affect: Mood normal.        Behavior: Behavior normal.      UC Treatments / Results  Labs (all labs ordered are listed, but only abnormal results are displayed) Labs Reviewed - No data to display  EKG   Radiology No results found.  Procedures Procedures (including critical care time)  Medications  Ordered in UC Medications - No data to display  Initial Impression / Assessment and Plan / UC Course  I have reviewed the triage vital signs and the nursing notes.  Pertinent labs & imaging results that were available during my care of the patient were reviewed by me and considered in my medical decision making (see chart for details).   Ganglion cyst of the left index finger.  No indication of abscess or infection.  Instructed patient to follow-up with an orthopedic hand specialist.  Education provided on ganglion cyst.  Patient agrees to plan of care.   Final Clinical Impressions(s) / UC Diagnoses   Final diagnoses:  Ganglion cyst of finger of left hand     Discharge Instructions      Follow-up with a hand specialist.     ED Prescriptions   None    PDMP not reviewed this encounter.   Mickie Bail, NP 02/13/22 1300

## 2022-02-13 NOTE — ED Triage Notes (Signed)
Patient to Urgent Care with complaints of abscess present to her pointer finger on her left hand. Abscess has been present for three weeks. Patient attempted to drain area and it increased in size, no successful.   Denies any other symptoms.

## 2022-12-19 ENCOUNTER — Ambulatory Visit
Admission: RE | Admit: 2022-12-19 | Discharge: 2022-12-19 | Disposition: A | Discharge status: Home / Self Care | Payer: 59 | Source: Ambulatory Visit | Attending: Physician Assistant | Admitting: Physician Assistant

## 2022-12-19 ENCOUNTER — Other Ambulatory Visit: Payer: Self-pay

## 2022-12-19 VITALS — BP 113/68 | HR 77 | Temp 98.2°F | Resp 16

## 2022-12-19 DIAGNOSIS — H5712 Ocular pain, left eye: Secondary | ICD-10-CM | POA: Insufficient documentation

## 2022-12-19 DIAGNOSIS — H53149 Visual discomfort, unspecified: Secondary | ICD-10-CM | POA: Insufficient documentation

## 2022-12-19 DIAGNOSIS — Z113 Encounter for screening for infections with a predominantly sexual mode of transmission: Secondary | ICD-10-CM | POA: Diagnosis not present

## 2022-12-19 DIAGNOSIS — N760 Acute vaginitis: Secondary | ICD-10-CM | POA: Diagnosis not present

## 2022-12-19 LAB — POCT URINALYSIS DIP (MANUAL ENTRY)
Bilirubin, UA: NEGATIVE
Blood, UA: NEGATIVE
Glucose, UA: NEGATIVE mg/dL
Ketones, POC UA: NEGATIVE mg/dL
Leukocytes, UA: NEGATIVE
Nitrite, UA: NEGATIVE
Protein Ur, POC: NEGATIVE mg/dL
Spec Grav, UA: 1.02 (ref 1.010–1.025)
Urobilinogen, UA: 0.2 E.U./dL
pH, UA: 8.5 — AB (ref 5.0–8.0)

## 2022-12-19 MED ORDER — FLUCONAZOLE 150 MG PO TABS: ORAL_TABLET | ORAL | 0 refills | Status: AC

## 2022-12-19 NOTE — ED Triage Notes (Signed)
Patient presents to UC for vaginitis. States she has noted some vaginal discharge x 1 week. Reports hx of yeast infections. No concern for STDs. Has not treated with any OTC meds.

## 2022-12-19 NOTE — ED Provider Notes (Signed)
[facility]    CSN: [id number] Arrival date & time: 12/19/22  1056      History   Chief Complaint Chief Complaint  Patient presents with  . Vaginitis    HPI Carrie Conner is a 32 y.o. female.   HPI  History reviewed. No pertinent past medical history.  Patient Active Problem List   Diagnosis Date Noted  . Eye pain, left 12/19/2022  . Sensitiveness to light 12/19/2022  . Impacted cerumen of both ears 12/14/2015  . 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 CESAREAN SECTION      OB History   No obstetric history on file.      Home Medications    Prior to Admission medications   Medication Sig Start Date End Date Taking? Authorizing Provider  amitriptyline (ELAVIL) 25 MG tablet Take 2 tablets (50 mg total) by mouth at bedtime. Follow titration instructions provided in writing. 10/09/20   [name], MD  ibuprofen (ADVIL) 200 MG tablet Take 200 mg by mouth every 6 (six) hours as needed.    [provider]    Family History History reviewed. No pertinent family history.  Social History Social History   Tobacco Use  . Smoking status: Former  . Smokeless tobacco: Never  Vaping Use  . Vaping status: Never Used  Substance Use Topics  . Alcohol use: No  . Drug use: Yes    Types: Marijuana     Allergies   Codeine   Review of Systems Review of Systems  Constitutional:  Negative for chills and fever.  Eyes:  Negative for discharge and redness.  Gastrointestinal:  Negative for abdominal pain, nausea and vomiting.  Genitourinary:  Positive for vaginal discharge. Negative for dysuria.     Physical Exam Triage Vital Signs ED Triage Vitals [12/19/22 1114]  Encounter Vitals Group     BP 113/68     Systolic BP Percentile      Diastolic BP Percentile      Pulse Rate 77     Resp 16     Temp 98.2 F (36.8 C)     Temp Source Oral     SpO2 98 %     Weight      Height      Head  Circumference      Peak Flow      Pain Score 0     Pain Loc      Pain Education      Exclude from Growth Chart    No data found.  Updated Vital Signs BP 113/68 (BP Location: Right Arm)   Pulse 77   Temp 98.2 F (36.8 C) (Oral)   Resp 16   LMP 11/28/2022 (Approximate)   SpO2 98%   Visual Acuity Right Eye Distance:   Left Eye Distance:   Bilateral Distance:    Right Eye Near:   Left Eye Near:    Bilateral Near:     Physical Exam Vitals and nursing note reviewed.  Constitutional:      General: She is not in acute distress.    Appearance: Normal appearance. She is not ill-appearing.  HENT:     Head: Normocephalic and atraumatic.  Eyes:     Conjunctiva/sclera: Conjunctivae normal.  Cardiovascular:     Rate and Rhythm: Normal rate.  Pulmonary:     Effort: Pulmonary effort is normal.  Neurological:     Mental Status: She is alert.  Psychiatric:        Mood and Affect: Mood normal.        Behavior: Behavior normal.        Thought Content: Thought content normal.     UC Treatments / Results  Labs (all labs ordered are listed, but only abnormal results are displayed) Labs Reviewed  POCT URINALYSIS DIP (MANUAL ENTRY)  CERVICOVAGINAL ANCILLARY ONLY    EKG   Radiology No results found.  Procedures Procedures (including critical care time)  Medications Ordered in UC Medications - No data to display  Initial Impression / Assessment and Plan / UC Course  I have reviewed the triage vital signs and the nursing notes.  Pertinent labs & imaging results that were available during my care of the patient were reviewed by me and considered in my medical decision making (see chart for details).     *** Final Clinical Impressions(s) / UC Diagnoses   Final diagnoses:  None   Discharge Instructions   None    ED Prescriptions   None    PDMP not reviewed this encounter.

## 2022-12-20 LAB — HIV ANTIBODY (ROUTINE TESTING W REFLEX): HIV Screen 4th Generation wRfx: NONREACTIVE

## 2022-12-20 LAB — RPR: RPR Ser Ql: NONREACTIVE

## 2022-12-24 ENCOUNTER — Telehealth: Payer: Self-pay

## 2022-12-24 NOTE — Telephone Encounter (Signed)
Per protocol, pt will need to return to UC for Nurse Visit to recollect cervical cytology.  Attempted to reach patient x1. LVM

## 2022-12-25 LAB — CERVICOVAGINAL ANCILLARY ONLY
Comment: NEGATIVE
Comment: NEGATIVE
Comment: NEGATIVE
Comment: NEGATIVE
Comment: NEGATIVE
Comment: NORMAL

## 2022-12-25 LAB — ACUTE VIRAL HEPATITIS (HAV, HBV, HCV)
HCV Ab: NONREACTIVE
Hep A IgM: NEGATIVE
Hep B C IgM: NEGATIVE
Hepatitis B Surface Ag: NEGATIVE

## 2022-12-25 LAB — SPECIMEN STATUS REPORT

## 2022-12-25 LAB — HCV INTERPRETATION
